# Patient Record
Sex: Female | Born: 1945
Health system: Southern US, Community
[De-identification: ages and names within clinical notes are randomized; demographics above are authoritative.]

## PROBLEM LIST (undated history)

## (undated) DIAGNOSIS — I10 Essential (primary) hypertension: Secondary | ICD-10-CM

## (undated) DIAGNOSIS — F329 Major depressive disorder, single episode, unspecified: Secondary | ICD-10-CM

## (undated) DIAGNOSIS — R51 Headache: Secondary | ICD-10-CM

## (undated) DIAGNOSIS — K219 Gastro-esophageal reflux disease without esophagitis: Secondary | ICD-10-CM

## (undated) DIAGNOSIS — D62 Acute posthemorrhagic anemia: Secondary | ICD-10-CM

## (undated) DIAGNOSIS — R739 Hyperglycemia, unspecified: Secondary | ICD-10-CM

## (undated) DIAGNOSIS — I739 Peripheral vascular disease, unspecified: Secondary | ICD-10-CM

## (undated) DIAGNOSIS — F32A Depression, unspecified: Secondary | ICD-10-CM

## (undated) DIAGNOSIS — R0602 Shortness of breath: Secondary | ICD-10-CM

## (undated) DIAGNOSIS — M1990 Unspecified osteoarthritis, unspecified site: Secondary | ICD-10-CM

## (undated) HISTORY — PX: RECTOVAGINAL FISTULA CLOSURE: SUR265

## (undated) HISTORY — PX: TONSILLECTOMY: SUR1361

## (undated) HISTORY — PX: TOTAL KNEE ARTHROPLASTY: SHX125

## (undated) HISTORY — PX: CARPAL TUNNEL RELEASE: SHX101

---

## 2006-04-13 ENCOUNTER — Encounter: Admission: RE | Admit: 2006-04-13 | Discharge: 2006-04-13 | Payer: Self-pay | Admitting: Emergency Medicine

## 2007-01-24 ENCOUNTER — Encounter: Admission: RE | Admit: 2007-01-24 | Discharge: 2007-01-24 | Payer: Self-pay | Admitting: Family Medicine

## 2007-01-28 ENCOUNTER — Encounter: Admission: RE | Admit: 2007-01-28 | Discharge: 2007-01-28 | Payer: Self-pay | Admitting: Family Medicine

## 2009-02-05 ENCOUNTER — Encounter: Payer: Self-pay | Admitting: Emergency Medicine

## 2009-02-05 ENCOUNTER — Ambulatory Visit: Payer: Self-pay | Admitting: Vascular Surgery

## 2009-02-05 ENCOUNTER — Ambulatory Visit (HOSPITAL_COMMUNITY): Admission: RE | Admit: 2009-02-05 | Discharge: 2009-02-05 | Payer: Self-pay | Admitting: Emergency Medicine

## 2009-09-13 ENCOUNTER — Encounter: Admission: RE | Admit: 2009-09-13 | Discharge: 2009-09-13 | Payer: Self-pay | Admitting: Family Medicine

## 2009-09-14 ENCOUNTER — Encounter: Admission: RE | Admit: 2009-09-14 | Discharge: 2009-09-14 | Payer: Self-pay | Admitting: Family Medicine

## 2010-02-08 ENCOUNTER — Observation Stay (HOSPITAL_COMMUNITY): Admission: EM | Admit: 2010-02-08 | Discharge: 2010-02-09 | Payer: Self-pay | Admitting: Emergency Medicine

## 2010-05-07 DIAGNOSIS — I739 Peripheral vascular disease, unspecified: Secondary | ICD-10-CM

## 2010-05-07 HISTORY — DX: Peripheral vascular disease, unspecified: I73.9

## 2010-05-07 HISTORY — PX: JOINT REPLACEMENT: SHX530

## 2010-05-11 ENCOUNTER — Inpatient Hospital Stay (HOSPITAL_COMMUNITY)
Admission: RE | Admit: 2010-05-11 | Discharge: 2010-05-16 | Payer: Self-pay | Source: Home / Self Care | Admitting: Orthopedic Surgery

## 2010-05-23 ENCOUNTER — Ambulatory Visit: Payer: Self-pay | Admitting: Vascular Surgery

## 2010-05-23 ENCOUNTER — Encounter (INDEPENDENT_AMBULATORY_CARE_PROVIDER_SITE_OTHER): Payer: Self-pay | Admitting: Orthopedic Surgery

## 2010-05-23 ENCOUNTER — Ambulatory Visit: Admission: RE | Admit: 2010-05-23 | Discharge: 2010-05-23 | Payer: Self-pay | Admitting: Orthopedic Surgery

## 2010-08-29 ENCOUNTER — Encounter: Payer: Self-pay | Admitting: Emergency Medicine

## 2010-08-29 ENCOUNTER — Encounter: Payer: Self-pay | Admitting: Family Medicine

## 2010-08-29 NOTE — Discharge Summary (Addendum)
NAMELELAH, RENNAKER NO.:  [id number]  MEDICAL RECORD NO.:  [id number]          PATIENT TYPE:  OUT  LOCATION:  DFTL                         FACILITY:  [facility]  PHYSICIAN:  Georges Lynch. Jamir Rone, M.D.DATE OF BIRTH:  06-Oct-1945  DATE OF ADMISSION:  05/23/2010 DATE OF DISCHARGE:  05/23/2010                              DISCHARGE SUMMARY   ADMITTING DIAGNOSES: 1. End-stage arthritis of the left knee. 2. History of migraines. 3. Anxiety. 4. Depression. 5. Dentures full, on top and partial and bottom. 6. Hypertension. 7. Hypercholesterolemia. 8. Gastroesophageal reflux disease. 9. Osteoarthritis. 10.History of menopause.  DISCHARGE DIAGNOSES: 1. End-stage arthritis of the left knee, status post left total knee     arthroplasty. 2. History of migraines. 3. Anxiety. 4. Depression. 5. Dentures full, on top and partial and bottom. 6. Hypertension. 7. Hypercholesterolemia. 8. Gastroesophageal reflux disease. 9. Osteoarthritis. 10.History of menopause.  LABORATORY DATA:  Preoperative CBC reveals white count of 5.0, hemoglobin 14.2, hematocrit 40.9, and a platelet count of 351. Preoperative INR was 0.94.  The patient was not on anticoagulants preoperatively.  Preoperative chemistry panel revealed very slight hypokalemia at 3.4 and elevated glucose at 107.  Preoperative urinalysis was unremarkable.  Preoperative PCR for MRSA and staph aureus were both negative.  On postoperative day #1, her hemoglobin dropped to 11.4, INR had reached 1.0.  On postop day #2, hemoglobin dropped further to 10.2. on postoperative day #3, dropped to a low of 10.1.  The patient did not require blood transfusion.  By postoperative day #5, her discharge day, hemoglobin had risen to 10.9 and by discharge her INR had reached 1.88.  HOSPITAL COURSE:  The patient was admitted to [facility] on May 11, 2010, and she underwent left total knee arthroplasty.  Please see the procedure  portion for that information.  After adequate time in the recovery area, she was transported to the floor for further recovery.  She was started on Coumadin the evening of her surgery and this was monitored per Pharmacy.  On postoperative day #1, the patient was doing well.  Her dressing was changed as she did have quite a bit of bleeding.  She was visited by Physical Therapy, postoperative day #1, however, they did not do morning session, the afternoon session.  She was able to ambulate 8 feet with moderate assistance and work on transfers.  Also, on postoperative day #1, the patient's Foley was discontinued and her PCA was also discontinued.  On postoperative day #2, the patient continued to progress.  She was having some difficulty with itching from her pain medications.  She was given Vistaril.  She also progressed with her physical therapy, by postop day #3, she was able to ambulate 100 feet using a rolling walker without assistance. Dressing was again changed and her wound is well-approximated, no signs of infection.  On postoperative day #5, she was discharged from the hospital.  DISPOSITION:  To home on postop day #5, May 16, 2010.  PROCEDURE:  On May 11, 2010, Ms. Wayment was taken to the operating room by surgeon, Dr. Ranee Gosselin; assistant, Rozell Searing PA-C. She underwent left  total knee arthroplasty under general anesthesia. Routine orthopedic prep and drape were carried out.  She received 1 g of IV Ancef preoperatively.  There were no complications with the procedure and she was returned to the recovery room in satisfactory condition. Postoperative radiographs revealed postsurgical changes of left total knee replacement without complication.  MEDICATIONS ON DISCHARGE: 1. Vicodin, she will discontinue. 2. Aspirin 81 mg, she will discontinue. 3. Culturelle. 4. Zyrtec. 5. Vitamin B12 6. Vitamin D. 7. Alprazolam 8. Hydrochlorothiazide. 9.  Cymbalta. 10.Lipitor. 11.Dexlansoprazole. 12.Potassium chloride. 13.Percocet. 14.Robaxin. 15.Coumadin.  SPECIAL INSTRUCTIONS:  Her Coumadin will be monitored per [facility] and her INR will be kept between 2-3 to remain therapeutic.  ACTIVITY:  She will increase activity slowly, again she will have home PT to help work on range of motion.  She is able to shower and she should use the walker at all times at this point.  WOUND CARE:  Daily dressing change.  DIET:  No restrictions.  FOLLOWUP:  Ms. Stoneberg will follow up with Dr. Darrelyn Hillock 2 weeks from the day of surgery.  She should contact the office at [phone] to schedule this appointment.  CONDITION ON DISCHARGE:  Improving.     Rozell Searing, PAC   ______________________________ Georges Lynch Darrelyn Hillock, M.D.    LD/MEDQ  D:  08/28/2010  T:  08/29/2010  Job:  [id number]  Electronically Signed by Rozell Searing  on 08/29/2010 09:20:43 AM Electronically Signed by Ranee Gosselin M.D. on 08/29/2010 11:50:17 AM

## 2010-10-20 LAB — URINALYSIS, ROUTINE W REFLEX MICROSCOPIC
Glucose, UA: NEGATIVE mg/dL
Ketones, ur: NEGATIVE mg/dL
Nitrite: NEGATIVE
Specific Gravity, Urine: 1.01 (ref 1.005–1.030)
pH: 6.5 (ref 5.0–8.0)

## 2010-10-20 LAB — APTT
aPTT: 29 seconds (ref 24–37)
aPTT: 34 seconds (ref 24–37)
aPTT: 47 seconds — ABNORMAL HIGH (ref 24–37)
aPTT: 60 seconds — ABNORMAL HIGH (ref 24–37)
aPTT: 30 seconds (ref 24–37)

## 2010-10-20 LAB — COMPREHENSIVE METABOLIC PANEL
ALT: 23 U/L (ref 0–35)
AST: 24 U/L (ref 0–37)
Albumin: 3.9 g/dL (ref 3.5–5.2)
Alkaline Phosphatase: 62 U/L (ref 39–117)
BUN: 8 mg/dL (ref 6–23)
Chloride: 101 mEq/L (ref 96–112)
GFR calc Af Amer: >60 mL/min (ref >60)
Potassium: 3.4 mEq/L — ABNORMAL LOW (ref 3.5–5.1)
Sodium: 139 mEq/L (ref 135–145)
Total Bilirubin: 0.5 mg/dL (ref 0.3–1.2)
Total Protein: 7.1 g/dL (ref 6.0–8.3)

## 2010-10-20 LAB — PROTIME-INR
INR: 1 (ref 0.00–1.49)
INR: 1.48 (ref 0.00–1.49)
Prothrombin Time: 13.4 seconds (ref 11.6–15.2)
Prothrombin Time: 19.7 seconds — ABNORMAL HIGH (ref 11.6–15.2)

## 2010-10-20 LAB — DIFFERENTIAL
Basophils Relative: 0 % (ref 0–1)
Eosinophils Relative: 5 % (ref 0–5)
Monocytes Absolute: 0.4 10*3/uL (ref 0.1–1.0)
Monocytes Relative: 9 % (ref 3–12)
Neutro Abs: 2.4 10*3/uL (ref 1.7–7.7)

## 2010-10-20 LAB — TYPE AND SCREEN

## 2010-10-20 LAB — CBC
Platelets: 351 10*3/uL (ref 150–400)
RBC: 4.32 MIL/uL (ref 3.87–5.11)
RDW: 14.9 % (ref 11.5–15.5)
WBC: 5 10*3/uL (ref 4.0–10.5)

## 2010-10-20 LAB — HEMOGLOBIN AND HEMATOCRIT, BLOOD
HCT: 30 % — ABNORMAL LOW (ref 36.0–46.0)
HCT: 30 % — ABNORMAL LOW (ref 36.0–46.0)
Hemoglobin: 10.2 g/dL — ABNORMAL LOW (ref 12.0–15.0)
Hemoglobin: 10.3 g/dL — ABNORMAL LOW (ref 12.0–15.0)

## 2010-10-20 LAB — SURGICAL PCR SCREEN: MRSA, PCR: NEGATIVE

## 2010-10-23 LAB — MAGNESIUM
Magnesium: 1.6 mg/dL (ref 1.5–2.5)
Magnesium: 2.4 mg/dL (ref 1.5–2.5)

## 2010-10-23 LAB — POCT I-STAT, CHEM 8
BUN: 5 mg/dL — ABNORMAL LOW (ref 6–23)
Chloride: 94 mEq/L — ABNORMAL LOW (ref 96–112)
Glucose, Bld: 89 mg/dL (ref 70–99)
HCT: 42 % (ref 36.0–46.0)
Potassium: 2.2 mEq/L — CL (ref 3.5–5.1)

## 2010-10-23 LAB — COMPREHENSIVE METABOLIC PANEL
ALT: 16 U/L (ref 0–35)
AST: 17 U/L (ref 0–37)
Albumin: 3.2 g/dL — ABNORMAL LOW (ref 3.5–5.2)
Alkaline Phosphatase: 53 U/L (ref 39–117)
Potassium: 3.4 mEq/L — ABNORMAL LOW (ref 3.5–5.1)
Sodium: 141 mEq/L (ref 135–145)
Total Protein: 5.7 g/dL — ABNORMAL LOW (ref 6.0–8.3)

## 2010-10-23 LAB — CBC
Platelets: 292 10*3/uL (ref 150–400)
RBC: 4 MIL/uL (ref 3.87–5.11)
RDW: 14.7 % (ref 11.5–15.5)
WBC: 4.6 10*3/uL (ref 4.0–10.5)

## 2010-10-23 LAB — URINALYSIS, ROUTINE W REFLEX MICROSCOPIC
Bilirubin Urine: NEGATIVE
Glucose, UA: NEGATIVE mg/dL
Nitrite: NEGATIVE
Specific Gravity, Urine: 1.005 (ref 1.005–1.030)
pH: 6.5 (ref 5.0–8.0)

## 2010-10-23 LAB — PHOSPHORUS: Phosphorus: 2.9 mg/dL (ref 2.3–4.6)

## 2010-10-23 LAB — VITAMIN B12: Vitamin B-12: 280 pg/mL (ref 211–911)

## 2010-10-23 LAB — TSH: TSH: 1.21 u[IU]/mL (ref 0.350–4.500)

## 2012-02-22 ENCOUNTER — Other Ambulatory Visit (HOSPITAL_COMMUNITY)
Admission: RE | Admit: 2012-02-22 | Discharge: 2012-02-22 | Disposition: A | Discharge status: Home / Self Care | Payer: BC Managed Care – PPO | Source: Ambulatory Visit | Attending: Family Medicine | Admitting: Family Medicine

## 2012-02-22 DIAGNOSIS — Z124 Encounter for screening for malignant neoplasm of cervix: Secondary | ICD-10-CM | POA: Insufficient documentation

## 2012-02-22 DIAGNOSIS — Z1151 Encounter for screening for human papillomavirus (HPV): Secondary | ICD-10-CM | POA: Insufficient documentation

## 2012-05-24 ENCOUNTER — Other Ambulatory Visit: Payer: Self-pay | Admitting: Orthopedic Surgery

## 2012-05-24 DIAGNOSIS — M5137 Other intervertebral disc degeneration, lumbosacral region: Secondary | ICD-10-CM

## 2012-05-27 ENCOUNTER — Other Ambulatory Visit: Payer: BC Managed Care – PPO

## 2012-05-28 ENCOUNTER — Ambulatory Visit
Admission: RE | Admit: 2012-05-28 | Discharge: 2012-05-28 | Disposition: A | Discharge status: Home / Self Care | Payer: BC Managed Care – PPO | Source: Ambulatory Visit | Attending: Orthopedic Surgery | Admitting: Orthopedic Surgery

## 2012-05-28 DIAGNOSIS — M5137 Other intervertebral disc degeneration, lumbosacral region: Secondary | ICD-10-CM

## 2012-06-03 ENCOUNTER — Other Ambulatory Visit: Payer: Self-pay | Admitting: Orthopedic Surgery

## 2012-06-03 DIAGNOSIS — M79605 Pain in left leg: Secondary | ICD-10-CM

## 2012-06-21 ENCOUNTER — Other Ambulatory Visit: Payer: BC Managed Care – PPO

## 2012-07-17 NOTE — H&P (Signed)
TOTAL HIP ADMISSION H&P  Patient is admitted for left total hip arthroplasty.  Subjective:  Chief Complaint: left hip pain  HPI: Jamie Gilmore, 66 y.o. female, has a history of pain and functional disability in the left hip(s) due to avascular necrosis and patient has failed non-surgical conservative treatments for greater than 12 weeks to include NSAID's and/or analgesics, corticosteriod injections, use of assistive devices and activity modification.  Onset of symptoms was gradual starting 1 year ago with rapidlly worsening course since that time.The patient noted no past surgery on the left hip(s).  Patient currently rates pain in the left hip at 9 out of 10 with activity. Patient has night pain, worsening of pain with activity and weight bearing, pain that interfers with activities of daily living and pain with passive range of motion. Patient has evidence of subchondral cysts, joint space narrowing and avascular necrosis by imaging studies. This condition presents safety issues increasing the risk of falls. There is no current active infection.  Past Medical History Migraines Anxiety Hypertension Hypercholesteremia GERD Menopause  Past Surgical History Tubal Ligation Carpal Tunnel Release, left Left knee arthroscopy Left TKA Tonsillectomy  Allergies NDKA   History  Substance Use Topics  . Smoking status: Former smoker  . Smokeless tobacco: None  . Alcohol Use: Occasional glass of wine    Family History Father dies age 110 due to cardiomegaly, TB complications Mother deceased age 68 due to CHF, cancer  Review of Systems  Constitutional: Positive for malaise/fatigue. Negative for fever, chills, weight loss and diaphoresis.  HENT: Negative.  Negative for neck pain.   Eyes: Negative.   Respiratory: Negative.   Cardiovascular: Negative.   Gastrointestinal: Positive for heartburn. Negative for nausea, vomiting, abdominal pain, diarrhea, constipation, blood in stool and  melena.  Genitourinary: Negative.   Musculoskeletal: Positive for myalgias, back pain and joint pain. Negative for falls.       Left hip pain  Skin: Negative.   Neurological: Negative.  Negative for weakness.  Endo/Heme/Allergies: Negative.   Psychiatric/Behavioral: Negative.     Objective:  Physical Exam  Constitutional: She is oriented to person, place, and time. She appears well-developed and well-nourished. No distress.  HENT:  Head: Normocephalic and atraumatic.  Right Ear: External ear normal.  Left Ear: External ear normal.  Nose: Nose normal.  Mouth/Throat: Oropharynx is clear and moist.  Eyes: Conjunctivae normal and EOM are normal.  Neck: Normal range of motion. Neck supple. No tracheal deviation present. No thyromegaly present.  Cardiovascular: Normal rate, regular rhythm, normal heart sounds and intact distal pulses.   No murmur heard. Respiratory: Effort normal and breath sounds normal. No respiratory distress. She has no wheezes. She exhibits no tenderness.  GI: Soft. Bowel sounds are normal. She exhibits no distension and no mass. There is no tenderness.  Musculoskeletal:       Right hip: Normal.       Left hip: She exhibits decreased range of motion and tenderness.       Right knee: Normal.       Left knee: Normal.       Right lower leg: She exhibits no tenderness and no swelling.       Left lower leg: She exhibits no tenderness and no swelling.       Legs: Lymphadenopathy:    She has no cervical adenopathy.  Neurological: She is alert and oriented to person, place, and time. She has normal strength and normal reflexes. No sensory deficit.  Skin: No rash  noted. She is not diaphoretic. No erythema.  Psychiatric: She has a normal mood and affect. Her behavior is normal.    Vitals Weight: 219 lb Height: 65 in Body Surface Area: 2.13 m Body Mass Index: 36.44 kg/m Pulse: 82 (Regular) BP: 132/85 (Sitting, Left Arm, Standard)  Imaging Review Plain  radiographs demonstrate moderate degenerative joint disease of the left hip(s) due to avascular necrosis. The bone quality appears to be fair for age and reported activity level.  Assessment/Plan:  End stage avascular necrosis, left hip(s)  The patient history, physical examination, clinical judgement of the provider and imaging studies are consistent with end stage degenerative joint disease of the left hip(s) due to avascular necrosis and total hip arthroplasty is deemed medically necessary. The treatment options including medical management, injection therapy, arthroscopy and arthroplasty were discussed at length. The risks and benefits of total hip arthroplasty were presented and reviewed. The risks due to aseptic loosening, infection, stiffness, dislocation/subluxation,  thromboembolic complications and other imponderables were discussed.  The patient acknowledged the explanation, agreed to proceed with the plan and consent was signed. Patient is being admitted for inpatient treatment for surgery, pain control, PT, OT, prophylactic antibiotics, VTE prophylaxis, progressive ambulation and ADL's and discharge planning.The patient is planning to be discharged home with home health services     [place], [place]

## 2012-07-22 ENCOUNTER — Other Ambulatory Visit (HOSPITAL_COMMUNITY): Payer: Self-pay | Admitting: Orthopedic Surgery

## 2012-07-22 ENCOUNTER — Encounter (HOSPITAL_COMMUNITY): Payer: Self-pay | Admitting: Pharmacy Technician

## 2012-07-23 ENCOUNTER — Encounter (HOSPITAL_COMMUNITY)
Admission: RE | Admit: 2012-07-23 | Discharge: 2012-07-23 | Disposition: A | Discharge status: Home / Self Care | Payer: BC Managed Care – PPO | Source: Ambulatory Visit | Attending: Orthopedic Surgery | Admitting: Orthopedic Surgery

## 2012-07-23 ENCOUNTER — Encounter (HOSPITAL_COMMUNITY): Payer: Self-pay

## 2012-07-23 ENCOUNTER — Ambulatory Visit (HOSPITAL_COMMUNITY)
Admission: RE | Admit: 2012-07-23 | Discharge: 2012-07-23 | Disposition: A | Discharge status: Home / Self Care | Payer: BC Managed Care – PPO | Source: Ambulatory Visit | Attending: Surgical | Admitting: Surgical

## 2012-07-23 DIAGNOSIS — Z01812 Encounter for preprocedural laboratory examination: Secondary | ICD-10-CM | POA: Insufficient documentation

## 2012-07-23 DIAGNOSIS — Z01818 Encounter for other preprocedural examination: Secondary | ICD-10-CM | POA: Insufficient documentation

## 2012-07-23 HISTORY — DX: Shortness of breath: R06.02

## 2012-07-23 HISTORY — DX: Depression, unspecified: F32.A

## 2012-07-23 HISTORY — DX: Essential (primary) hypertension: I10

## 2012-07-23 HISTORY — DX: Peripheral vascular disease, unspecified: I73.9

## 2012-07-23 HISTORY — DX: Hyperglycemia, unspecified: R73.9

## 2012-07-23 HISTORY — DX: Gastro-esophageal reflux disease without esophagitis: K21.9

## 2012-07-23 HISTORY — DX: Unspecified osteoarthritis, unspecified site: M19.90

## 2012-07-23 HISTORY — DX: Major depressive disorder, single episode, unspecified: F32.9

## 2012-07-23 HISTORY — DX: Headache: R51

## 2012-07-23 LAB — COMPREHENSIVE METABOLIC PANEL
ALT: 19 U/L (ref 0–35)
AST: 14 U/L (ref 0–37)
Albumin: 3.6 g/dL (ref 3.5–5.2)
Alkaline Phosphatase: 77 U/L (ref 39–117)
BUN: 8 mg/dL (ref 6–23)
CO2: 30 mEq/L (ref 19–32)
Calcium: 9.5 mg/dL (ref 8.4–10.5)
Chloride: 98 mEq/L (ref 96–112)
Creatinine, Ser: 0.66 mg/dL (ref 0.50–1.10)
GFR calc Af Amer: >90 mL/min (ref >90)
GFR calc non Af Amer: >90 mL/min (ref >90)
Glucose, Bld: 109 mg/dL — ABNORMAL HIGH (ref 70–99)
Potassium: 4 mEq/L (ref 3.5–5.1)
Sodium: 138 mEq/L (ref 135–145)
Total Bilirubin: 0.4 mg/dL (ref 0.3–1.2)
Total Protein: 7 g/dL (ref 6.0–8.3)

## 2012-07-23 LAB — APTT: aPTT: 30 seconds (ref 24–37)

## 2012-07-23 LAB — SURGICAL PCR SCREEN
MRSA, PCR: NEGATIVE
Staphylococcus aureus: POSITIVE — AB

## 2012-07-23 LAB — URINALYSIS, ROUTINE W REFLEX MICROSCOPIC
Bilirubin Urine: NEGATIVE
Glucose, UA: NEGATIVE mg/dL
Hgb urine dipstick: NEGATIVE
Ketones, ur: NEGATIVE mg/dL
Nitrite: NEGATIVE
Protein, ur: NEGATIVE mg/dL
Specific Gravity, Urine: 1.019 (ref 1.005–1.030)
Urobilinogen, UA: 0.2 mg/dL (ref 0.0–1.0)
pH: 8 (ref 5.0–8.0)

## 2012-07-23 LAB — CBC
Hemoglobin: 14.1 g/dL (ref 12.0–15.0)
RBC: 4.61 MIL/uL (ref 3.87–5.11)

## 2012-07-23 LAB — URINE MICROSCOPIC-ADD ON

## 2012-07-23 LAB — PROTIME-INR
INR: 0.95 (ref 0.00–1.49)
Prothrombin Time: 12.6 seconds (ref 11.6–15.2)

## 2012-07-23 NOTE — Progress Notes (Signed)
ekg 11/13 on chart

## 2012-07-23 NOTE — Progress Notes (Signed)
Faxed abnormal urine with micro to Dr Darrelyn Hillock per [employer],  Faxed STOP BANG SCREEN to Juanita Craver PA  With confirmation

## 2012-07-23 NOTE — Progress Notes (Signed)
07/23/12 0935  OBSTRUCTIVE SLEEP APNEA  Have you ever been diagnosed with sleep apnea through a sleep study? No  Do you snore loudly (loud enough to be heard through closed doors)?  0  Do you often feel tired, fatigued, or sleepy during the daytime? 1  Has anyone observed you stop breathing during your sleep? 0  Do you have, or are you being treated for high blood pressure? 1  BMI more than 35 kg/m2? 1  Age over 64 years old? 1  Neck circumference greater than 40 cm/18 inches? 0  Gender: 0  Obstructive Sleep Apnea Score 4   Score 4 or greater  Results sent to PCP

## 2012-07-23 NOTE — Patient Instructions (Signed)
20 Jamie Gilmore  07/23/2012   Your procedure is scheduled on:  07/29/12  MONDAY  Report to [facility] at   0530    AM.  Call this number if you have problems the morning of surgery: [phone]       Remember:   Do not eat food  Or drink :After Midnight.  Sunday NIGHT   Take these medicines the morning of surgery with A SIP OF WATER:  PROZAC, PROTONIX      VENTOLIN inhaler  Or NORCO IF NEEDED   .  Contacts, dentures or partial plates can not be worn to surgery  Leave suitcase in the car. After surgery it may be brought to your room.  For patients admitted to the hospital, checkout time is 11:00 AM day of  discharge.             SPECIAL INSTRUCTIONS- SEE [place] PREPARING FOR SURGERY INSTRUCTION SHEET-     DO NOT WEAR JEWELRY, LOTIONS, POWDERS, OR PERFUMES.  WOMEN-- DO NOT SHAVE LEGS OR UNDERARMS FOR 12 HOURS BEFORE SHOWERS. MEN MAY SHAVE FACE.  Patients discharged the day of surgery will not be allowed to drive home. IF going home the day of surgery, you must have a driver and someone to stay with you for the first 24 hours  Name and phone number of your driver:   ? rehab                                                                     Please read over the following fact sheets that you were given: MRSA Information, Incentive Spirometry Sheet, Blood Transfusion Sheet  Information                                                                                   [name]  PST 336  [phone]                 FAILURE TO FOLLOW THESE INSTRUCTIONS MAY RESULT IN  CANCELLATION   OF YOUR SURGERY                                                  Patient Signature _____________________________

## 2012-07-28 NOTE — Anesthesia Preprocedure Evaluation (Addendum)
Anesthesia Evaluation  Patient identified by MRN, date of birth, ID band Patient awake    Reviewed: Allergy & Precautions, H&P , NPO status , Patient's Chart, lab work & pertinent test results  Airway Mallampati: II TM Distance: >3 FB Neck ROM: full    Dental  (+) Edentulous Upper, Edentulous Lower and Dental Advisory Given   Pulmonary neg pulmonary ROS, shortness of breath and with exertion,  breath sounds clear to auscultation  Pulmonary exam normal       Cardiovascular hypertension, Pt. on medications Rhythm:regular Rate:Normal     Neuro/Psych Depression negative neurological ROS  negative psych ROS   GI/Hepatic negative GI ROS, Neg liver ROS, GERD-  Medicated,  Endo/Other  diabetesBorderline DM  Renal/GU negative Renal ROS  negative genitourinary   Musculoskeletal   Abdominal   Peds  Hematology negative hematology ROS (+) Hx. DVT s/p TKA   Anesthesia Other Findings   Reproductive/Obstetrics negative OB ROS                          Anesthesia Physical Anesthesia Plan  ASA: II  Anesthesia Plan: General   Post-op Pain Management:    Induction: Intravenous  Airway Management Planned: Oral ETT  Additional Equipment:   Intra-op Plan:   Post-operative Plan: Extubation in OR  Informed Consent: I have reviewed the patients History and Physical, chart, labs and discussed the procedure including the risks, benefits and alternatives for the proposed anesthesia with the patient or authorized representative who has indicated his/her understanding and acceptance.   Dental Advisory Given  Plan Discussed with: CRNA and Surgeon  Anesthesia Plan Comments:         Anesthesia Quick Evaluation

## 2012-07-29 ENCOUNTER — Inpatient Hospital Stay (HOSPITAL_COMMUNITY): Payer: BC Managed Care – PPO | Admitting: Anesthesiology

## 2012-07-29 ENCOUNTER — Encounter (HOSPITAL_COMMUNITY)
Admission: RE | Disposition: A | Discharge status: Skilled Nursing Facility | Payer: Self-pay | Source: Ambulatory Visit | Attending: Orthopedic Surgery

## 2012-07-29 ENCOUNTER — Encounter (HOSPITAL_COMMUNITY): Payer: Self-pay | Admitting: *Deleted

## 2012-07-29 ENCOUNTER — Inpatient Hospital Stay (HOSPITAL_COMMUNITY)
Admission: RE | Admit: 2012-07-29 | Discharge: 2012-08-05 | DRG: 818 | Disposition: A | Discharge status: Skilled Nursing Facility | Payer: BC Managed Care – PPO | Source: Ambulatory Visit | Attending: Orthopedic Surgery | Admitting: Orthopedic Surgery

## 2012-07-29 ENCOUNTER — Encounter (HOSPITAL_COMMUNITY): Payer: Self-pay | Admitting: Anesthesiology

## 2012-07-29 ENCOUNTER — Inpatient Hospital Stay (HOSPITAL_COMMUNITY): Payer: BC Managed Care – PPO

## 2012-07-29 DIAGNOSIS — D62 Acute posthemorrhagic anemia: Secondary | ICD-10-CM

## 2012-07-29 DIAGNOSIS — Z96649 Presence of unspecified artificial hip joint: Secondary | ICD-10-CM

## 2012-07-29 DIAGNOSIS — M87059 Idiopathic aseptic necrosis of unspecified femur: Principal | ICD-10-CM | POA: Diagnosis present

## 2012-07-29 DIAGNOSIS — F3289 Other specified depressive episodes: Secondary | ICD-10-CM | POA: Diagnosis present

## 2012-07-29 DIAGNOSIS — E119 Type 2 diabetes mellitus without complications: Secondary | ICD-10-CM | POA: Diagnosis present

## 2012-07-29 DIAGNOSIS — F329 Major depressive disorder, single episode, unspecified: Secondary | ICD-10-CM | POA: Diagnosis present

## 2012-07-29 DIAGNOSIS — M8970 Major osseous defect, unspecified site: Secondary | ICD-10-CM | POA: Diagnosis present

## 2012-07-29 DIAGNOSIS — K219 Gastro-esophageal reflux disease without esophagitis: Secondary | ICD-10-CM | POA: Diagnosis present

## 2012-07-29 DIAGNOSIS — I10 Essential (primary) hypertension: Secondary | ICD-10-CM | POA: Diagnosis present

## 2012-07-29 HISTORY — PX: TOTAL HIP ARTHROPLASTY: SHX124

## 2012-07-29 LAB — TYPE AND SCREEN
ABO/RH(D): O POS
Antibody Screen: NEGATIVE

## 2012-07-29 SURGERY — ARTHROPLASTY, HIP, TOTAL,POSTERIOR APPROACH
Anesthesia: General | Site: Hip | Laterality: Left | Wound class: Clean

## 2012-07-29 MED ORDER — FLEET ENEMA 7-19 GM/118ML RE ENEM: 1.0000 | ENEMA | Freq: Once | RECTAL | Status: AC | PRN

## 2012-07-29 MED ORDER — ZOLPIDEM TARTRATE 10 MG PO TABS
10.0000 mg | ORAL_TABLET | Freq: Every evening | ORAL | Status: DC | PRN
  Administered 2012-07-30 – 2012-08-04 (×6): 10 mg via ORAL
  Filled 2012-07-29 (×6): qty 1

## 2012-07-29 MED ORDER — SODIUM CHLORIDE 0.9 % IR SOLN
Status: DC | PRN
  Administered 2012-07-29: 09:00:00

## 2012-07-29 MED ORDER — LIDOCAINE HCL (CARDIAC) 20 MG/ML IV SOLN
INTRAVENOUS | Status: DC | PRN
  Administered 2012-07-29: 50 mg via INTRAVENOUS

## 2012-07-29 MED ORDER — ONDANSETRON HCL 4 MG PO TABS: 4.0000 mg | ORAL_TABLET | Freq: Four times a day (QID) | ORAL | Status: DC | PRN

## 2012-07-29 MED ORDER — METOCLOPRAMIDE HCL 5 MG/ML IJ SOLN
INTRAMUSCULAR | Status: DC | PRN
  Administered 2012-07-29: 10 mg via INTRAVENOUS

## 2012-07-29 MED ORDER — SUCCINYLCHOLINE CHLORIDE 20 MG/ML IJ SOLN
INTRAMUSCULAR | Status: DC | PRN
  Administered 2012-07-29: 100 mg via INTRAVENOUS

## 2012-07-29 MED ORDER — ONDANSETRON HCL 4 MG/2ML IJ SOLN: 4.0000 mg | Freq: Four times a day (QID) | INTRAMUSCULAR | Status: DC | PRN

## 2012-07-29 MED ORDER — GLYCOPYRROLATE 0.2 MG/ML IJ SOLN
INTRAMUSCULAR | Status: DC | PRN
  Administered 2012-07-29: 0.4 mg via INTRAVENOUS

## 2012-07-29 MED ORDER — SODIUM CHLORIDE 0.9 % IR SOLN
Status: DC | PRN
  Administered 2012-07-29: 08:00:00

## 2012-07-29 MED ORDER — METHOCARBAMOL 100 MG/ML IJ SOLN
500.0000 mg | Freq: Four times a day (QID) | INTRAVENOUS | Status: DC | PRN
  Administered 2012-07-29 (×2): 500 mg via INTRAVENOUS
  Filled 2012-07-29 (×2): qty 5

## 2012-07-29 MED ORDER — ATORVASTATIN CALCIUM 20 MG PO TABS
20.0000 mg | ORAL_TABLET | Freq: Every day | ORAL | Status: DC
  Administered 2012-07-29 – 2012-08-04 (×7): 20 mg via ORAL
  Filled 2012-07-29 (×9): qty 1

## 2012-07-29 MED ORDER — CEFAZOLIN SODIUM 1-5 GM-% IV SOLN
1.0000 g | Freq: Four times a day (QID) | INTRAVENOUS | Status: AC
  Administered 2012-07-29 (×2): 1 g via INTRAVENOUS
  Filled 2012-07-29 (×2): qty 50

## 2012-07-29 MED ORDER — POTASSIUM CHLORIDE CRYS ER 10 MEQ PO TBCR
10.0000 meq | EXTENDED_RELEASE_TABLET | Freq: Two times a day (BID) | ORAL | Status: DC
  Administered 2012-07-29 – 2012-08-05 (×15): 10 meq via ORAL
  Filled 2012-07-29 (×18): qty 1

## 2012-07-29 MED ORDER — HYDROMORPHONE HCL PF 1 MG/ML IJ SOLN
INTRAMUSCULAR | Status: DC | PRN
  Administered 2012-07-29 (×4): 0.5 mg via INTRAVENOUS

## 2012-07-29 MED ORDER — BUPIVACAINE LIPOSOME 1.3 % IJ SUSP
20.0000 mL | Freq: Once | INTRAMUSCULAR | Status: AC
  Administered 2012-07-29: 20 mL
  Filled 2012-07-29: qty 20

## 2012-07-29 MED ORDER — PHENOL 1.4 % MT LIQD: 1.0000 | OROMUCOSAL | Status: DC | PRN

## 2012-07-29 MED ORDER — ALUM & MAG HYDROXIDE-SIMETH 200-200-20 MG/5ML PO SUSP
30.0000 mL | ORAL | Status: DC | PRN
  Administered 2012-07-30 – 2012-08-02 (×6): 30 mL via ORAL
  Filled 2012-07-29 (×6): qty 30

## 2012-07-29 MED ORDER — ACETAMINOPHEN 10 MG/ML IV SOLN
INTRAVENOUS | Status: DC | PRN
  Administered 2012-07-29: 1000 mg via INTRAVENOUS

## 2012-07-29 MED ORDER — POLYETHYLENE GLYCOL 3350 17 G PO PACK: 17.0000 g | PACK | Freq: Every day | ORAL | Status: DC | PRN

## 2012-07-29 MED ORDER — SODIUM CHLORIDE 0.9 % IJ SOLN
INTRAMUSCULAR | Status: DC | PRN
  Administered 2012-07-29: 20 mL

## 2012-07-29 MED ORDER — ONDANSETRON HCL 4 MG/2ML IJ SOLN
INTRAMUSCULAR | Status: DC | PRN
  Administered 2012-07-29: 4 mg via INTRAVENOUS

## 2012-07-29 MED ORDER — ALBUTEROL SULFATE HFA 108 (90 BASE) MCG/ACT IN AERS
2.0000 | INHALATION_SPRAY | Freq: Four times a day (QID) | RESPIRATORY_TRACT | Status: DC | PRN
  Filled 2012-07-29: qty 6.7

## 2012-07-29 MED ORDER — BISACODYL 10 MG RE SUPP: 10.0000 mg | Freq: Every day | RECTAL | Status: DC | PRN

## 2012-07-29 MED ORDER — ACETAMINOPHEN 325 MG PO TABS: 650.0000 mg | ORAL_TABLET | Freq: Four times a day (QID) | ORAL | Status: DC | PRN

## 2012-07-29 MED ORDER — AMLODIPINE BESYLATE 5 MG PO TABS
5.0000 mg | ORAL_TABLET | Freq: Every day | ORAL | Status: DC
  Administered 2012-07-31 – 2012-08-04 (×5): 5 mg via ORAL
  Filled 2012-07-29 (×9): qty 1

## 2012-07-29 MED ORDER — MENTHOL 3 MG MT LOZG
1.0000 | LOZENGE | OROMUCOSAL | Status: DC | PRN
  Administered 2012-07-30: 3 mg via ORAL
  Filled 2012-07-29: qty 9

## 2012-07-29 MED ORDER — LACTATED RINGERS IV SOLN: INTRAVENOUS | Status: DC

## 2012-07-29 MED ORDER — ACETAMINOPHEN 650 MG RE SUPP: 650.0000 mg | Freq: Four times a day (QID) | RECTAL | Status: DC | PRN

## 2012-07-29 MED ORDER — PANTOPRAZOLE SODIUM 20 MG PO TBEC
20.0000 mg | DELAYED_RELEASE_TABLET | Freq: Every day | ORAL | Status: DC
  Administered 2012-07-30 – 2012-08-05 (×7): 20 mg via ORAL
  Filled 2012-07-29 (×11): qty 1

## 2012-07-29 MED ORDER — PROPOFOL 10 MG/ML IV BOLUS
INTRAVENOUS | Status: DC | PRN
  Administered 2012-07-29: 180 mg via INTRAVENOUS

## 2012-07-29 MED ORDER — LACTATED RINGERS IV SOLN
INTRAVENOUS | Status: DC
  Administered 2012-07-29 (×3): via INTRAVENOUS

## 2012-07-29 MED ORDER — NEOSTIGMINE METHYLSULFATE 1 MG/ML IJ SOLN
INTRAMUSCULAR | Status: DC | PRN
  Administered 2012-07-29: 3 mg via INTRAVENOUS

## 2012-07-29 MED ORDER — RIVAROXABAN 10 MG PO TABS
10.0000 mg | ORAL_TABLET | Freq: Every day | ORAL | Status: DC
  Administered 2012-07-30 – 2012-08-05 (×7): 10 mg via ORAL
  Filled 2012-07-29 (×10): qty 1

## 2012-07-29 MED ORDER — HYDROMORPHONE HCL PF 1 MG/ML IJ SOLN
1.0000 mg | INTRAMUSCULAR | Status: DC | PRN
  Administered 2012-07-29 – 2012-07-30 (×5): 1 mg via INTRAVENOUS
  Filled 2012-07-29 (×6): qty 1

## 2012-07-29 MED ORDER — CEFAZOLIN SODIUM-DEXTROSE 2-3 GM-% IV SOLR
2.0000 g | INTRAVENOUS | Status: AC
  Administered 2012-07-29: 2 g via INTRAVENOUS

## 2012-07-29 MED ORDER — LACTATED RINGERS IV SOLN
INTRAVENOUS | Status: DC
  Administered 2012-07-29: 100 mL/h via INTRAVENOUS
  Administered 2012-07-29 – 2012-07-30 (×3): via INTRAVENOUS

## 2012-07-29 MED ORDER — HYDROCODONE-ACETAMINOPHEN 5-325 MG PO TABS
1.0000 | ORAL_TABLET | ORAL | Status: DC | PRN
  Administered 2012-07-29 – 2012-08-05 (×33): 2 via ORAL
  Filled 2012-07-29 (×33): qty 2

## 2012-07-29 MED ORDER — HYDROCHLOROTHIAZIDE 25 MG PO TABS
25.0000 mg | ORAL_TABLET | Freq: Every day | ORAL | Status: DC
  Administered 2012-07-30 – 2012-08-05 (×7): 25 mg via ORAL
  Filled 2012-07-29 (×8): qty 1

## 2012-07-29 MED ORDER — OXYCODONE-ACETAMINOPHEN 5-325 MG PO TABS
2.0000 | ORAL_TABLET | ORAL | Status: DC | PRN
  Administered 2012-07-30 – 2012-07-31 (×6): 2 via ORAL
  Filled 2012-07-29 (×6): qty 2

## 2012-07-29 MED ORDER — MIDAZOLAM HCL 5 MG/5ML IJ SOLN
INTRAMUSCULAR | Status: DC | PRN
  Administered 2012-07-29: 2 mg via INTRAVENOUS

## 2012-07-29 MED ORDER — FLUOXETINE HCL 20 MG PO TABS
20.0000 mg | ORAL_TABLET | Freq: Every day | ORAL | Status: DC
  Administered 2012-07-30 – 2012-08-05 (×7): 20 mg via ORAL
  Filled 2012-07-29 (×10): qty 1

## 2012-07-29 MED ORDER — METHOCARBAMOL 500 MG PO TABS
500.0000 mg | ORAL_TABLET | Freq: Four times a day (QID) | ORAL | Status: DC | PRN
  Administered 2012-07-29 – 2012-08-05 (×17): 500 mg via ORAL
  Filled 2012-07-29 (×18): qty 1

## 2012-07-29 MED ORDER — ROCURONIUM BROMIDE 100 MG/10ML IV SOLN
INTRAVENOUS | Status: DC | PRN
  Administered 2012-07-29: 15 mg via INTRAVENOUS
  Administered 2012-07-29: 55 mg via INTRAVENOUS
  Administered 2012-07-29: 20 mg via INTRAVENOUS

## 2012-07-29 MED ORDER — FERROUS SULFATE 325 (65 FE) MG PO TABS
325.0000 mg | ORAL_TABLET | Freq: Three times a day (TID) | ORAL | Status: DC
  Administered 2012-07-29 – 2012-08-05 (×19): 325 mg via ORAL
  Filled 2012-07-29 (×27): qty 1

## 2012-07-29 MED ORDER — HYDROMORPHONE HCL PF 1 MG/ML IJ SOLN
0.2500 mg | INTRAMUSCULAR | Status: DC | PRN
  Administered 2012-07-29 (×4): 0.5 mg via INTRAVENOUS

## 2012-07-29 MED ORDER — THROMBIN 5000 UNITS EX SOLR
OROMUCOSAL | Status: DC | PRN
  Administered 2012-07-29: 09:00:00 via TOPICAL

## 2012-07-29 MED ORDER — SUFENTANIL CITRATE 50 MCG/ML IV SOLN
INTRAVENOUS | Status: DC | PRN
  Administered 2012-07-29 (×2): 5 ug via INTRAVENOUS
  Administered 2012-07-29: 10 ug via INTRAVENOUS
  Administered 2012-07-29 (×3): 5 ug via INTRAVENOUS
  Administered 2012-07-29: 10 ug via INTRAVENOUS
  Administered 2012-07-29: 5 ug via INTRAVENOUS

## 2012-07-29 SURGICAL SUPPLY — 54 items
ADH SKN CLS APL DERMABOND .7 (GAUZE/BANDAGES/DRESSINGS) ×1
BAG SPEC THK2 15X12 ZIP CLS (MISCELLANEOUS) ×1
BAG ZIPLOCK 12X15 (MISCELLANEOUS) ×2 IMPLANT
BLADE SAW SAG 73X25 THK (BLADE) ×1
BLADE SAW SGTL 73X25 THK (BLADE) ×1 IMPLANT
CLOTH BEACON ORANGE TIMEOUT ST (SAFETY) ×2 IMPLANT
DERMABOND ADVANCED (GAUZE/BANDAGES/DRESSINGS) ×1
DERMABOND ADVANCED .7 DNX12 (GAUZE/BANDAGES/DRESSINGS) IMPLANT
DRAPE INCISE IOBAN 66X45 STRL (DRAPES) ×2 IMPLANT
DRAPE INCISE IOBAN 85X60 (DRAPES) ×2 IMPLANT
DRAPE ORTHO SPLIT 77X108 STRL (DRAPES) ×4
DRAPE POUCH INSTRU U-SHP 10X18 (DRAPES) ×2 IMPLANT
DRAPE SURG 17X11 SM STRL (DRAPES) ×2 IMPLANT
DRAPE SURG ORHT 6 SPLT 77X108 (DRAPES) ×2 IMPLANT
DRAPE U-SHAPE 47X51 STRL (DRAPES) ×2 IMPLANT
DRSG AQUACEL AG ADV 3.5X 4 (GAUZE/BANDAGES/DRESSINGS) ×1 IMPLANT
DRSG AQUACEL AG ADV 3.5X10 (GAUZE/BANDAGES/DRESSINGS) ×1 IMPLANT
DRSG TEGADERM 4X4.75 (GAUZE/BANDAGES/DRESSINGS) ×1 IMPLANT
DURAPREP 26ML APPLICATOR (WOUND CARE) ×2 IMPLANT
ELECT BLADE TIP CTD 4 INCH (ELECTRODE) ×2 IMPLANT
ELECT REM PT RETURN 9FT ADLT (ELECTROSURGICAL) ×2
ELECTRODE REM PT RTRN 9FT ADLT (ELECTROSURGICAL) ×1 IMPLANT
ELIMINATOR HOLE APEX DEPUY (Hips) IMPLANT
EVACUATOR 1/8 PVC DRAIN (DRAIN) ×2 IMPLANT
FACESHIELD LNG OPTICON STERILE (SAFETY) ×8 IMPLANT
GAUZE SPONGE 2X2 8PLY STRL LF (GAUZE/BANDAGES/DRESSINGS) IMPLANT
GLOVE BIOGEL PI IND STRL 8 (GLOVE) ×1 IMPLANT
GLOVE BIOGEL PI INDICATOR 8 (GLOVE) ×2
GLOVE ECLIPSE 8.0 STRL XLNG CF (GLOVE) ×8 IMPLANT
GOWN PREVENTION PLUS LG XLONG (DISPOSABLE) ×4 IMPLANT
GOWN STRL REIN XL XLG (GOWN DISPOSABLE) ×4 IMPLANT
IMMOBILIZER KNEE 20 (SOFTGOODS) ×4
IMMOBILIZER KNEE 20 THIGH 36 (SOFTGOODS) IMPLANT
KIT BASIN OR (CUSTOM PROCEDURE TRAY) ×2 IMPLANT
MANIFOLD NEPTUNE II (INSTRUMENTS) ×2 IMPLANT
NEEDLE HYPO 22GX1.5 SAFETY (NEEDLE) ×2 IMPLANT
PACK TOTAL JOINT (CUSTOM PROCEDURE TRAY) ×2 IMPLANT
POSITIONER SURGICAL ARM (MISCELLANEOUS) ×2 IMPLANT
SPONGE GAUZE 2X2 STER 10/PKG (GAUZE/BANDAGES/DRESSINGS) ×1
SPONGE LAP 18X18 X RAY DECT (DISPOSABLE) ×2 IMPLANT
SPONGE SURGIFOAM ABS GEL 100 (HEMOSTASIS) ×2 IMPLANT
STAPLER VISISTAT 35W (STAPLE) ×2 IMPLANT
SUCTION FRAZIER TIP 10 FR DISP (SUCTIONS) ×2 IMPLANT
SUT MNCRL AB 4-0 PS2 18 (SUTURE) ×1 IMPLANT
SUT VIC AB 0 CT1 27 (SUTURE)
SUT VIC AB 0 CT1 27XBRD ANTBC (SUTURE) ×2 IMPLANT
SUT VIC AB 1 CT1 27 (SUTURE) ×8
SUT VIC AB 1 CT1 27XBRD ANTBC (SUTURE) ×5 IMPLANT
SUT VIC AB 2-0 CT1 27 (SUTURE) ×4
SUT VIC AB 2-0 CT1 TAPERPNT 27 (SUTURE) IMPLANT
SYR 20CC LL (SYRINGE) ×2 IMPLANT
TOWEL OR 17X26 10 PK STRL BLUE (TOWEL DISPOSABLE) ×4 IMPLANT
TRAY FOLEY CATH 14FRSI W/METER (CATHETERS) ×2 IMPLANT
WATER STERILE IRR 1500ML POUR (IV SOLUTION) ×3 IMPLANT

## 2012-07-29 NOTE — Brief Op Note (Signed)
07/29/2012  8:50 AM  PATIENT:  Jamie Gilmore  66 y.o. female  PRE-OPERATIVE DIAGNOSIS:  AVASCULA NECROSIS  LEFT HIP   POST-OPERATIVE DIAGNOSIS:  AVASCULA NECROSIS  LEFT HIP   PROCEDURE:  Procedure(s) (LRB) with comments: TOTAL HIP ARTHROPLASTY (Left),Complex.  SURGEON:  Surgeon(s) and Role:    * Jacki Cones, MD - Primary    * Shelda Pal, MD - Assisting  PHYSICIAN ASSISTANT: Leilani Able PA  ASSISTANTS: Durene Romans MD   ANESTHESIA:   general  EBL:  Total I/O In: 2000 [I.V.:2000] Out: 325 [Urine:150; Blood:175]  BLOOD ADMINISTERED:none  DRAINS: (ONe) Hemovact drain(s) in the Left HIP. with  Suction Open   LOCAL MEDICATIONS USED:  BUPIVICAINE 20cc mixed with 20cc Normal Saline  SPECIMEN:  No Specimen  DISPOSITION OF SPECIMEN:  N/A  COUNTS:  YES  TOURNIQUET:  * No tourniquets in log *  DICTATION: .Other Dictation: Dictation Number [id number]  PLAN OF CARE: Admit to inpatient   PATIENT DISPOSITION:  Stable in OR.   Delay start of Pharmacological VTE agent (>24hrs) due to surgical blood loss or risk of bleeding: yes

## 2012-07-29 NOTE — Transfer of Care (Signed)
Immediate Anesthesia Transfer of Care Note  Patient: Jamie Gilmore  Procedure(s) Performed: Procedure(s) (LRB) with comments: TOTAL HIP ARTHROPLASTY (Left)  Patient Location: PACU  Anesthesia Type:General  Level of Consciousness: awake, alert , oriented and patient cooperative  Airway & Oxygen Therapy: Patient Spontanous Breathing and Patient connected to nasal cannula oxygen  Post-op Assessment: Report given to PACU RN, Post -op Vital signs reviewed and stable and Patient moving all extremities  Post vital signs: Reviewed and stable  Complications: No apparent anesthesia complications

## 2012-07-29 NOTE — Op Note (Signed)
NAMEJENESE, MISCHKE NO.:  [id number]  MEDICAL RECORD NO.:  [id number]  LOCATION:  1601                         FACILITY:  [facility]  PHYSICIAN:  Georges Lynch. Xcaret Morad, M.D.DATE OF BIRTH:  05-14-1946  DATE OF PROCEDURE:  07/29/2012 DATE OF DISCHARGE:                              OPERATIVE REPORT   SURGEON:  Georges Lynch. Muneer Leider, MD  ASSISTANTS: 1. Madlyn Frankel Charlann Boxer, MD 2. Jaquelyn Bitter. Chabon, PA  PREOPERATIVE DIAGNOSIS:  Avascular necrosis to left femoral head.  POSTOPERATIVE DIAGNOSIS:  Avascular necrosis to left femoral head.  OPERATION:  Left total hip arthroplasty utilizing the DePuy system.  I utilized a size 4 high offset Tri-Lock stem with a +1.5 ceramic head, 36 mm in diameter.  The cup was a pinnacle cup, size 52 mm outside diameter with 1 screw.  The insert was a 36 mm diameter AltrX polyethylene insert.  PROCEDURE:  Under general anesthesia with the patient on the left side up, routine orthopedic prep and draping of the left hip was carried out. She had 2 g of IV Ancef.  Note as I said, the appropriate time-out was carried out prior to surgery.  I also marked the appropriate left hip in the holding area.  A posterolateral approach to hip was carried out. Bleeders were identified and cauterized.  I then inserted self-retaining retractors.  I identified the iliotibial band, made an incision and then dissected a small part of the gluteal muscle by blunt dissection.  At this time, I partially detached external rotators to cauterize the bleeders.  Great care was taken not to injure the underlying sciatic nerve.  I then went down, did a capsulectomy, dislocated the head, amputated femoral head to appropriate neck length.  At this time, I utilized my box osteotome to remove the cancellous bone from the trochanter.  I then used my widening reamer than a canal finer.  I then rasped the femoral canal up to a size 4 Tri-Lock stem.  I thoroughly irrigated out the canal  several times, packed the canal open and next attention was directed to the acetabulum.  I completed my capsulectomy and then reamed the acetabulum up to a size 51 for a 52 mm cup.  A 52 mm pinnacle cup then was inserted.  We had good stability.  We utilized the Charnley guide and made sure we had good position.  At this point, one screw was utilized for fixation purposes.  I then inserted my AltrX cup inside diameter 36 mm.  The cup was secured.  We then went through our leg length measurements again and selected a +1.5 ceramic ball.  At this time, we selected a high offset Tri-Lock stem and inserted that after the sponge was removed from the canal.  I then inserted our permanent 1.5 ceramic head 36 mm diameter, reduced the hip and had excellent stability in all planes. There was no impingement and we had good stability.  I thoroughly irrigated out the area and reapproximated the wound in usual fashion over Hemovac drain.  I did inject 20 mL mixture of Exparel and normal saline.  Subcu was closed with a running locking subcu suture.  It was a Monocryl  suture we used.  Sterile dressings were applied.          ______________________________ Georges Lynch Darrelyn Hillock, M.D.     RAG/MEDQ  D:  07/29/2012  T:  07/29/2012  Job:  [id number]

## 2012-07-29 NOTE — Interval H&P Note (Signed)
History and Physical Interval Note:  07/29/2012 7:11 AM  Jamie Gilmore  has presented today for surgery, with the diagnosis of AVASCULA NECROSIS  LEFT HIP   The various methods of treatment have been discussed with the patient and family. After consideration of risks, benefits and other options for treatment, the patient has consented to  Procedure(s) (LRB) with comments: TOTAL HIP ARTHROPLASTY (Left) as a surgical intervention .  The patient's history has been reviewed, patient examined, no change in status, stable for surgery.  I have reviewed the patient's chart and labs.  Questions were answered to the patient's satisfaction.     Courney Garrod A

## 2012-07-29 NOTE — Anesthesia Procedure Notes (Addendum)
Performed by: Durward Parcel A   Procedure Name: Intubation Performed by: Durward Parcel A Pre-anesthesia Checklist: Patient identified, Emergency Drugs available, Suction available and Patient being monitored Patient Re-evaluated:Patient Re-evaluated prior to inductionOxygen Delivery Method: Circle system utilized Preoxygenation: Pre-oxygenation with 100% oxygen Intubation Type: Rapid sequence Ventilation: Mask ventilation without difficulty Tube type: Oral Tube size: 7.5 mm Number of attempts: 1 Airway Equipment and Method: Stylet Placement Confirmation: ETT inserted through vocal cords under direct vision,  positive ETCO2 and breath sounds checked- equal and bilateral Secured at: 21 cm Tube secured with: Tape Dental Injury: Teeth and Oropharynx as per pre-operative assessment

## 2012-07-29 NOTE — Anesthesia Postprocedure Evaluation (Signed)
  Anesthesia Post-op Note  Patient: Jamie Gilmore  Procedure(s) Performed: Procedure(s) (LRB): TOTAL HIP ARTHROPLASTY (Left)  Patient Location: PACU  Anesthesia Type: General  Level of Consciousness: awake and alert   Airway and Oxygen Therapy: Patient Spontanous Breathing  Post-op Pain: mild  Post-op Assessment: Post-op Vital signs reviewed, Patient's Cardiovascular Status Stable, Respiratory Function Stable, Patent Airway and No signs of Nausea or vomiting  Last Vitals:  Filed Vitals:   07/29/12 1000  BP: 110/75  Pulse: 79  Temp:   Resp: 17    Post-op Vital Signs: stable   Complications: No apparent anesthesia complications

## 2012-07-30 ENCOUNTER — Encounter (HOSPITAL_COMMUNITY): Payer: Self-pay | Admitting: Orthopedic Surgery

## 2012-07-30 LAB — BASIC METABOLIC PANEL
Calcium: 8.4 mg/dL (ref 8.4–10.5)
GFR calc non Af Amer: 90 mL/min — ABNORMAL LOW (ref >90)
Glucose, Bld: 104 mg/dL — ABNORMAL HIGH (ref 70–99)
Sodium: 134 mEq/L — ABNORMAL LOW (ref 135–145)

## 2012-07-30 LAB — CBC
Hemoglobin: 11 g/dL — ABNORMAL LOW (ref 12.0–15.0)
MCH: 30.5 pg (ref 26.0–34.0)
MCHC: 33.1 g/dL (ref 30.0–36.0)
Platelets: 289 10*3/uL (ref 150–400)
RDW: 15.2 % (ref 11.5–15.5)

## 2012-07-30 NOTE — Evaluation (Signed)
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  She needed +2 assistance at eval for safety but should progress well.  Pt is appropriate for skilled OT to increase independence with adls.  Goals in acute are min A level.      OT Assessment  Patient needs continued OT Services    Follow Up Recommendations  SNF    Barriers to Discharge      Equipment Recommendations  3 in 1 bedside comode    Recommendations for Other Services    Frequency  Min 2X/week    Precautions / Restrictions Precautions Precautions: Posterior Hip Restrictions Weight Bearing Restrictions: Yes LLE Weight Bearing: Partial weight bearing   Pertinent Vitals/Pain L hip 5/10 with movement.  Pt felt lightheaded after standing several minutes.  Repositioned in chair    ADL  Grooming: Set up Where Assessed - Grooming: Unsupported sitting Upper Body Bathing: Supervision/safety Where Assessed - Upper Body Bathing: Supported sit to stand Lower Body Bathing: +2 Total assistance Lower Body Bathing: Patient Percentage: 40% Where Assessed - Lower Body Bathing: Supported sit to stand Upper Body Dressing: Supervision/safety Where Assessed - Upper Body Dressing: Unsupported sitting Lower Body Dressing: +2 Total assistance Lower Body Dressing: Patient Percentage: 30% Where Assessed - Lower Body Dressing: Supported sit to stand Toilet Transfer: +2 Total assistance Toilet Transfer: Patient Percentage: 70% Toilet Transfer Method: Sit to stand (bed to chair:  took steps in room) Transfers/Ambulation Related to ADLs: +2 for safety; pt a little shaky ADL Comments: Educated on AE and demonstrated.  Pt did not practice with this yet.    OT Diagnosis: Generalized weakness  OT Problem  List: Decreased strength;Decreased activity tolerance;Decreased knowledge of use of DME or AE;Decreased knowledge of precautions;Pain OT Treatment Interventions: Self-care/ADL training;DME and/or AE instruction;Patient/family education   OT Goals Acute Rehab OT Goals OT Goal Formulation: With patient Time For Goal Achievement: 08/06/12 Potential to Achieve Goals: Good ADL Goals Pt Will Perform Lower Body Bathing: with min assist;Sit to stand from chair;with adaptive equipment ADL Goal: Lower Body Bathing - Progress: Goal set today Pt Will Perform Lower Body Dressing: with min assist;Sit to stand from chair;with adaptive equipment ADL Goal: Lower Body Dressing - Progress: Goal set today Pt Will Transfer to Toilet: with min assist;Ambulation;3-in-1;Maintaining hip precautions;Maintaining back safety precautions;with cueing (comment type and amount) (min cues) ADL Goal: Toilet Transfer - Progress: Goal set today Pt Will Perform Toileting - Hygiene: with min assist;Standing at 3-in-1/toilet (min guard, and clothes management) ADL Goal: Toileting - Hygiene - Progress: Goal set today  Visit Information  Last OT Received On: 07/30/12 Assistance Needed: +2 PT/OT Co-Evaluation/Treatment: Yes    Subjective Data  Subjective: I feel a little lightheaded Patient Stated Goal: rehab then home   Prior Functioning     Home Living Additional Comments: plans snf Communication Communication: No difficulties         Vision/Perception     Cognition  Overall Cognitive Status: Appears within functional limits for tasks assessed/performed Arousal/Alertness: Awake/alert Orientation Level: Appears intact for tasks assessed Behavior During Session: [facility] for tasks performed    Extremity/Trunk Assessment Right Upper Extremity Assessment RUE ROM/Strength/Tone: Within functional levels (states UE status variable.  Today strength 4-/5 bil) Left Upper Extremity Assessment LUE ROM/Strength/Tone:  Within functional levels  Mobility Transfers Transfers: Sit to Stand Sit to Stand: From elevated surface;With upper extremity assist;From bed;1: +2 Total assist Sit to Stand: Patient Percentage: 70%     Shoulder Instructions     Exercise     Balance     End of Session OT - End of Session Activity Tolerance: Patient limited by pain;Patient limited by fatigue (lightheaded) Patient left: in chair;with call bell/phone within reach  GO     [facility] 07/30/2012, 11:36 AM Marica Otter, OTR/L [phone] 07/30/2012

## 2012-07-30 NOTE — Progress Notes (Signed)
Subjective: Doing very well today.Will make plans for SNF   Objective: Vital signs in last 24 hours: Temp:  [97.8 F (36.6 C)-99.2 F (37.3 C)] 98.7 F (37.1 C) (12/24 0626) Pulse Rate:  [77-103] 92  (12/24 0626) Resp:  [10-17] 16  (12/24 0626) BP: (110-134)/(75-88) 132/86 mmHg (12/24 0626) SpO2:  [94 %-100 %] 97 % (12/24 0626) Weight:  [100.699 kg (222 lb)] 100.699 kg (222 lb) (12/23 1019)  Intake/Output from previous day: 12/23 0701 - 12/24 0700 In: 4690 [P.O.:840; I.V.:3700; IV Piggyback:150] Out: 2140 [Urine:1615; Drains:350; Blood:175] Intake/Output this shift: Total I/O In: 1800 [P.O.:600; I.V.:1100; IV Piggyback:100] Out: 1200 [Urine:1050; Drains:150]   Basename 07/30/12 0437  HGB 11.0*    Basename 07/30/12 0437  WBC 7.6  RBC 3.61*  HCT 33.2*  PLT 289    Basename 07/30/12 0437  NA 134*  K 3.2*  CL 97  CO2 31  BUN 9  CREATININE 0.70  GLUCOSE 104*  CALCIUM 8.4   No results found for this basename: LABPT:2,INR:2 in the last 72 hours  Dorsiflexion/Plantar flexion intact No cellulitis present  Assessment/Plan: SNF Thursday since    Thanh Mottern A 07/30/2012, 6:46 AM

## 2012-07-30 NOTE — Progress Notes (Signed)
Utilization review completed.  

## 2012-07-30 NOTE — Progress Notes (Signed)
Patient expressed interest in [facility] as it is close to their house. CSW awaiting call back from [facility] [place] re: bed offer/availability. Anticipating discharge Thursday.   Clinical Social Work Department CLINICAL SOCIAL WORK PLACEMENT NOTE 07/30/2012  Patient:  MONIA, TIMMERS  Account Number:  [id number] Admit date:  07/29/2012  Clinical Social Worker:  Orpah Greek  Date/time:  07/30/2012 12:56 PM  Clinical Social Work is seeking post-discharge placement for this patient at the following level of care:   SKILLED NURSING   (*CSW will update this form in Epic as items are completed)   07/30/2012  Patient/family provided with [facility] Department of Clinical Social Work's list of facilities offering this level of care within the geographic area requested by the patient (or if unable, by the patient's family).  07/30/2012  Patient/family informed of their freedom to choose among providers that offer the needed level of care, that participate in Medicare, Medicaid or managed care program needed by the patient, have an available bed and are willing to accept the patient.  07/30/2012  Patient/family informed of [facility]' ownership interest in [facility], as well as of the fact that they are under no obligation to receive care at this facility.  PASARR submitted to EDS on 07/30/2012 PASARR number received from EDS on 07/30/2012  FL2 transmitted to all facilities in geographic area requested by pt/family on  07/30/2012 FL2 transmitted to all facilities within larger geographic area on   Patient informed that his/her managed care company has contracts with or will negotiate with  certain facilities, including the following:     Patient/family informed of bed offers received:   Patient chooses bed at  Physician recommends and patient chooses bed at    Patient to be transferred to  on   Patient to be transferred to facility by   The following physician  request were entered in Epic:   Additional Comments:  Unice Bailey, LCSW [facility] Clinical Social Worker cell #: [phone]

## 2012-07-30 NOTE — Care Management Note (Signed)
    Page 1 of 1   07/30/2012     10:49:16 AM   CARE MANAGEMENT NOTE 07/30/2012  Patient:  Jamie Gilmore, Jamie Gilmore   Account Number:  [id number]  Date Initiated:  07/30/2012  Documentation initiated by:  Lorenda Ishihara  Subjective/Objective Assessment:   66 yo female admitted s/p left THA. PTA lived at home with significant other/friend.     Action/Plan:   Plan for SNF for rehab   Anticipated DC Date:  08/01/2012   Anticipated DC Plan:  SKILLED NURSING FACILITY  In-house referral  Clinical Social Worker      DC Planning Services  CM consult      Choice offered to / List presented to:             Status of service:  Completed, signed off Medicare Important Message given?   (If response is "NO", the following Medicare IM given date fields will be blank) Date Medicare IM given:   Date Additional Medicare IM given:    Discharge Disposition:  SKILLED NURSING FACILITY  Per UR Regulation:  Reviewed for med. necessity/level of care/duration of stay  If discussed at Long Length of Stay Meetings, dates discussed:    Comments:

## 2012-07-30 NOTE — Progress Notes (Signed)
Clinical Social Work Department BRIEF PSYCHOSOCIAL ASSESSMENT 07/30/2012  Patient:  Jamie Gilmore, Jamie Gilmore     Account Number:  [id number]     Admit date:  07/29/2012  Clinical Social Worker:  Orpah Greek  Date/Time:  07/30/2012 12:34 PM  Referred by:  Physician  Date Referred:  07/30/2012 Referred for  SNF Placement   Other Referral:   Interview type:  Patient Other interview type:   and significant other    PSYCHOSOCIAL DATA Living Status:  FRIEND(S) Admitted from facility:   Level of care:   Primary support name:  Leavy Cella (significant other) h#: [phone] c#: [phone] Primary support relationship to patient:  FRIEND Degree of support available:   good    CURRENT CONCERNS Current Concerns  Post-Acute Placement   Other Concerns:    SOCIAL WORK ASSESSMENT / PLAN CSW spoke with patient & significant other at bedside re: discharge planning. Patient was admitted from home with significant other, note PT/MD recommending SNF at discharge.   Assessment/plan status:  Information/Referral to [facility] Other assessment/ plan:   Information/referral to community resources:   CSW completed FL2 and faxed information out to [facility], will follow-up with bed offers when available.    PATIENT'S/FAMILY'S RESPONSE TO PLAN OF CARE: Patient is agreeable to plan for SNF - expressed interest in [facility] as it is close to their house. CSW awaiting call back from [facility] @ [facility] re: bed offer/availability. Anticipating discharge Thursday.        Unice Bailey, LCSW [facility] Clinical Social Worker cell #: [phone]

## 2012-07-30 NOTE — Progress Notes (Signed)
Physical Therapy Treatment Patient Details Name: Jamie Gilmore MRN: [id number] DOB: [birth year] Today's Date: 07/30/2012 Time: 4696-2952 PT Time Calculation (min): 19 min  PT Assessment / Plan / Recommendation Comments on Treatment Session       Follow Up Recommendations  SNF     Does the patient have the potential to tolerate intense rehabilitation     Barriers to Discharge        Equipment Recommendations  None recommended by PT    Recommendations for Other Services OT consult  Frequency 7X/week   Plan Discharge plan remains appropriate    Precautions / Restrictions Precautions Precautions: Posterior Hip Precaution Comments: pt recalls 1/3 THP Restrictions Weight Bearing Restrictions: Yes LLE Weight Bearing: Partial weight bearing   Pertinent Vitals/Pain     Mobility  Bed Mobility Bed Mobility: Sit to Supine Sit to Supine: 1: +2 Total assist Sit to Supine: Patient Percentage: 60% Details for Bed Mobility Assistance: Increased time; cues for sequence, use of R LE to self assist and adherence to THP Transfers Transfers: Sit to Stand;Stand to Sit Sit to Stand: 3: Mod assist;With armrests;From chair/3-in-1 Stand to Sit: 3: Mod assist;With upper extremity assist;To bed;To elevated surface Details for Transfer Assistance: cues for use of UEs and for LE management Ambulation/Gait Ambulation/Gait Assistance: 1: +2 Total assist Ambulation/Gait: Patient Percentage: 70% Ambulation Distance (Feet): 6 Feet Assistive device: Rolling walker Ambulation/Gait Assistance Details: cues for sequence, posture and position from RW Gait Pattern: Step-to pattern Stairs: No    Exercises     PT Diagnosis:    PT Problem List:   PT Treatment Interventions:     PT Goals Acute Rehab PT Goals PT Goal Formulation: With patient Time For Goal Achievement: 08/03/12 Potential to Achieve Goals: Good Pt will go Supine/Side to Sit: with min assist PT Goal: Supine/Side to Sit -  Progress: Progressing toward goal Pt will go Sit to Supine/Side: with min assist PT Goal: Sit to Supine/Side - Progress: Progressing toward goal Pt will go Sit to Stand: with min assist PT Goal: Sit to Stand - Progress: Progressing toward goal Pt will go Stand to Sit: with min assist PT Goal: Stand to Sit - Progress: Progressing toward goal Pt will Ambulate: 51 - 150 feet;with min assist;with rolling walker PT Goal: Ambulate - Progress: Progressing toward goal  Visit Information  Last PT Received On: 07/30/12 Assistance Needed: +2    Subjective Data  Subjective: I think I'm ready to get back to bed Patient Stated Goal: Resume previous lifestyle with decreased pain   Cognition  Overall Cognitive Status: Appears within functional limits for tasks assessed/performed Arousal/Alertness: Awake/alert Orientation Level: Appears intact for tasks assessed Behavior During Session: [facility] for tasks performed    Balance     End of Session PT - End of Session Equipment Utilized During Treatment: Gait belt Activity Tolerance: Patient tolerated treatment well Patient left: in bed;with call bell/phone within reach Nurse Communication: Mobility status   GP     Jamie Gilmore 07/30/2012, 4:28 PM

## 2012-07-30 NOTE — Evaluation (Signed)
Physical Therapy Evaluation Patient Details Name: Jamie Gilmore MRN: [id number] DOB: [birth year] Today's Date: 07/30/2012 Time: 1020-1055 PT Time Calculation (min): 35 min  PT Assessment / Plan / Recommendation Clinical Impression  Pt s/p L THR presents with decreased L LE strength/ROM, post op post THR, post op pain , and PWB status on L LE limiting functional mobility    PT Assessment  Patient needs continued PT services    Follow Up Recommendations  SNF    Does the patient have the potential to tolerate intense rehabilitation      Barriers to Discharge Inaccessible home environment      Equipment Recommendations  None recommended by PT    Recommendations for Other Services OT consult   Frequency 7X/week    Precautions / Restrictions Precautions Precautions: Posterior Hip Precaution Comments: sign hung in room Restrictions Weight Bearing Restrictions: Yes LLE Weight Bearing: Partial weight bearing   Pertinent Vitals/Pain 6/10; premedicated; ice pack provided      Mobility  Bed Mobility Bed Mobility: Supine to Sit Supine to Sit: 1: +2 Total assist Supine to Sit: Patient Percentage: 70% Details for Bed Mobility Assistance: Increased time; cues for sequence, use of R LE to self assist and adherence to THP Transfers Transfers: Sit to Stand;Stand to Sit Sit to Stand: From elevated surface;With upper extremity assist;From bed;1: +2 Total assist Sit to Stand: Patient Percentage: 70% Stand to Sit: 1: +2 Total assist Stand to Sit: Patient Percentage: 70% Details for Transfer Assistance: cues for use of UEs and for LE management Ambulation/Gait Ambulation/Gait Assistance: 1: +2 Total assist Ambulation/Gait: Patient Percentage: 70% Ambulation Distance (Feet): 8 Feet Assistive device: Rolling walker Ambulation/Gait Assistance Details: cues for posture, sequence, position from RW and ER on R Gait Pattern: Step-to pattern Stairs: No    Shoulder Instructions      Exercises Total Joint Exercises Ankle Circles/Pumps: AROM;10 reps;Supine;Both Quad Sets: AROM;10 reps;Both;Supine Heel Slides: AAROM;Supine;Left;15 reps Hip ABduction/ADduction: AAROM;10 reps;Left;Supine   PT Diagnosis: Difficulty walking  PT Problem List: Decreased strength;Decreased range of motion;Decreased activity tolerance;Decreased mobility;Decreased knowledge of use of DME;Obesity;Decreased knowledge of precautions;Pain PT Treatment Interventions: DME instruction;Gait training;Stair training;Functional mobility training;Therapeutic activities;Therapeutic exercise;Patient/family education   PT Goals Acute Rehab PT Goals PT Goal Formulation: With patient Time For Goal Achievement: 08/03/12 Potential to Achieve Goals: Good Pt will go Supine/Side to Sit: with min assist Pt will go Sit to Supine/Side: with min assist Pt will go Sit to Stand: with min assist Pt will go Stand to Sit: with min assist Pt will Ambulate: 51 - 150 feet;with min assist;with rolling walker  Visit Information  Last PT Received On: 07/30/12 Assistance Needed: +2 PT/OT Co-Evaluation/Treatment: Yes    Subjective Data  Subjective: The Dr wants me to go to rehab because I have so many stairs at home Patient Stated Goal: Resume previous lifestyle with decreased pain   Prior Functioning  Home Living Lives With: Significant other Available Help at Discharge: Family Type of Home: Apartment Home Access: Stairs to enter Entrance Stairs-Number of Steps: 20 Entrance Stairs-Rails: Right Home Adaptive Equipment: Walker - rolling Additional Comments: plans snf Prior Function Level of Independence: Independent with assistive device(s) Able to Take Stairs?: Yes Vocation: Retired Musician: No difficulties    Cognition  Overall Cognitive Status: Appears within functional limits for tasks assessed/performed Arousal/Alertness: Awake/alert Orientation Level: Appears intact for tasks  assessed Behavior During Session: [facility] for tasks performed    Extremity/Trunk Assessment Right Upper Extremity Assessment RUE ROM/Strength/Tone: Within functional levels (states  UE status variable.  Today strength 4-/5 bil) Left Upper Extremity Assessment LUE ROM/Strength/Tone: Within functional levels Right Lower Extremity Assessment RLE ROM/Strength/Tone: WFL for tasks assessed Left Lower Extremity Assessment LLE ROM/Strength/Tone: Deficits LLE ROM/Strength/Tone Deficits: hip strength 2/5 with AAROM to 75 hip flex and 10 abd   Balance    End of Session PT - End of Session Equipment Utilized During Treatment: Gait belt Activity Tolerance: Patient tolerated treatment well Patient left: in chair;with call bell/phone within reach;with family/visitor present Nurse Communication: Mobility status  GP     Jamie Gilmore 07/30/2012, 12:11 PM

## 2012-07-31 LAB — BASIC METABOLIC PANEL
BUN: 5 mg/dL — ABNORMAL LOW (ref 6–23)
Chloride: 95 mEq/L — ABNORMAL LOW (ref 96–112)
GFR calc Af Amer: >90 mL/min (ref >90)
Potassium: 3.1 mEq/L — ABNORMAL LOW (ref 3.5–5.1)

## 2012-07-31 LAB — CBC
HCT: 31.5 % — ABNORMAL LOW (ref 36.0–46.0)
Platelets: 283 10*3/uL (ref 150–400)
RDW: 14.8 % (ref 11.5–15.5)
WBC: 8.3 10*3/uL (ref 4.0–10.5)

## 2012-07-31 MED ORDER — OXYCODONE HCL 5 MG PO TABS
5.0000 mg | ORAL_TABLET | Freq: Once | ORAL | Status: AC
  Administered 2012-07-31: 10 mg via ORAL
  Filled 2012-07-31: qty 2

## 2012-07-31 NOTE — Progress Notes (Signed)
   Subjective: 2 Days Post-Op Procedure(s) (LRB): TOTAL HIP ARTHROPLASTY (Left)   Patient reports pain as mild, pain well controlled. No events throughout the night.   Objective:   VITALS:   Filed Vitals:   07/31/12 0540  BP: 107/69  Pulse: 88  Temp: 98.9 F (37.2 C)  Resp: 16    Neurovascular intact Dorsiflexion/Plantar flexion intact Incision: dressing C/D/I No cellulitis present Compartment soft  LABS  Basename 07/31/12 0418 07/30/12 0437  HGB 10.4* 11.0*  HCT 31.5* 33.2*  WBC 8.3 7.6  PLT 283 289     Basename 07/31/12 0418 07/30/12 0437  NA 135 134*  K 3.1* 3.2*  BUN 5* 9  CREATININE 0.60 0.70  GLUCOSE 107* 104*     Assessment/Plan: 2 Days Post-Op Procedure(s) (LRB): TOTAL HIP ARTHROPLASTY (Left)  Up with therapy Discharge to SNF tomorrow, if she continues on current course.  They have decided on SNF and will communicate that to the social worker.   Anastasio Auerbach Ahren Pettinger   PAC  07/31/2012, 10:04 AM

## 2012-07-31 NOTE — Progress Notes (Signed)
Physical Therapy Treatment Patient Details Name: Jamie Gilmore MRN: [id number] DOB: 1946/08/04 Today's Date: 07/31/2012 Time: 3664-4034 PT Time Calculation (min): 33 min  PT Assessment / Plan / Recommendation Comments on Treatment Session       Follow Up Recommendations  SNF     Does the patient have the potential to tolerate intense rehabilitation     Barriers to Discharge        Equipment Recommendations  None recommended by PT    Recommendations for Other Services OT consult  Frequency 7X/week   Plan Discharge plan remains appropriate    Precautions / Restrictions Precautions Precautions: Posterior Hip Precaution Comments: pt recalls 1/3 THP Restrictions Weight Bearing Restrictions: Yes LLE Weight Bearing: Partial weight bearing LLE Partial Weight Bearing Percentage or Pounds: 50   Pertinent Vitals/Pain 6/10; premedicated, ice packs provided    Mobility  Bed Mobility Bed Mobility: Supine to Sit Supine to Sit: 3: Mod assist Details for Bed Mobility Assistance: Increased time; cues for sequence, use of R LE to self assist and adherence to THP Transfers Transfers: Sit to Stand;Stand to Sit Sit to Stand: 3: Mod assist;From bed;From chair/3-in-1;With upper extremity assist;4: Min assist;With armrests Stand to Sit: 3: Mod assist;With armrests;To chair/3-in-1;4: Min assist;With upper extremity assist Details for Transfer Assistance: cues for use of UEs and for LE management Ambulation/Gait Ambulation/Gait Assistance: 4: Min assist;3: Mod assist Ambulation Distance (Feet): 59 Feet (and 20) Assistive device: Rolling walker Ambulation/Gait Assistance Details: cues for posture, sequence, stride length, position from RW and ER on L Gait Pattern: Step-to pattern    Exercises     PT Diagnosis:    PT Problem List:   PT Treatment Interventions:     PT Goals Acute Rehab PT Goals PT Goal Formulation: With patient Time For Goal Achievement: 08/03/12 Potential to  Achieve Goals: Good Pt will go Supine/Side to Sit: with min assist PT Goal: Supine/Side to Sit - Progress: Progressing toward goal Pt will go Sit to Supine/Side: with min assist PT Goal: Sit to Supine/Side - Progress: Progressing toward goal Pt will go Sit to Stand: with min assist PT Goal: Sit to Stand - Progress: Progressing toward goal Pt will go Stand to Sit: with min assist PT Goal: Stand to Sit - Progress: Progressing toward goal Pt will Ambulate: 51 - 150 feet;with min assist;with rolling walker PT Goal: Ambulate - Progress: Progressing toward goal  Visit Information  Last PT Received On: 07/31/12 Assistance Needed: +1    Subjective Data  Subjective: I really need to use the bathroom Patient Stated Goal: Resume previous lifestyle with decreased pain   Cognition  Overall Cognitive Status: Appears within functional limits for tasks assessed/performed Arousal/Alertness: Awake/alert Orientation Level: Appears intact for tasks assessed Behavior During Session: [facility] for tasks performed    Balance     End of Session PT - End of Session Equipment Utilized During Treatment: Gait belt Activity Tolerance: Patient tolerated treatment well Patient left: in chair;with call bell/phone within reach;with family/visitor present Nurse Communication: Mobility status   GP     Ronetta Molla 07/31/2012, 2:07 PM

## 2012-07-31 NOTE — Progress Notes (Signed)
Spoke to [facility], [place] informed him patient had vicodin this am but c/o generalized pain, orders given for one time dose of oxycodone, states to also give a robaxin. Informed Pa that IV pulled out during night, Molli Hazard states ok to leave out for anticipated discharge tomorrow.

## 2012-07-31 NOTE — Progress Notes (Signed)
Physical Therapy Treatment Patient Details Name: Jamie Gilmore MRN: [id number] DOB: [birth year] Today's Date: 07/31/2012 Time: 1250-1303 PT Time Calculation (min): 13 min  PT Assessment / Plan / Recommendation Comments on Treatment Session       Follow Up Recommendations  SNF     Does the patient have the potential to tolerate intense rehabilitation     Barriers to Discharge        Equipment Recommendations  None recommended by PT    Recommendations for Other Services OT consult  Frequency 7X/week   Plan Discharge plan remains appropriate    Precautions / Restrictions Precautions Precautions: Posterior Hip Precaution Comments: pt recalls 1/3 THP Restrictions Weight Bearing Restrictions: Yes LLE Weight Bearing: Partial weight bearing LLE Partial Weight Bearing Percentage or Pounds: 50   Pertinent Vitals/Pain     Mobility  Bed Mobility Bed Mobility: Sit to Supine Supine to Sit: 3: Mod assist Sit to Supine: 4: Min assist;3: Mod assist Details for Bed Mobility Assistance: Increased time; cues for sequence, use of R LE to self assist and adherence to THP Transfers Transfers: Sit to Stand;Stand to Sit Sit to Stand: 4: Min assist;With upper extremity assist;From chair/3-in-1 Stand to Sit: 4: Min assist;To bed;With upper extremity assist Details for Transfer Assistance: cues for use of UEs and for LE management Ambulation/Gait Ambulation/Gait Assistance: 4: Min assist Ambulation Distance (Feet): 18 Feet Assistive device: Rolling walker Ambulation/Gait Assistance Details: cues for sequence and position from RW Gait Pattern: Step-to pattern    Exercises     PT Diagnosis:    PT Problem List:   PT Treatment Interventions:     PT Goals Acute Rehab PT Goals PT Goal Formulation: With patient Time For Goal Achievement: 08/03/12 Potential to Achieve Goals: Good Pt will go Supine/Side to Sit: with min assist PT Goal: Supine/Side to Sit - Progress: Progressing toward  goal Pt will go Sit to Supine/Side: with min assist PT Goal: Sit to Supine/Side - Progress: Progressing toward goal Pt will go Sit to Stand: with min assist PT Goal: Sit to Stand - Progress: Progressing toward goal Pt will go Stand to Sit: with min assist PT Goal: Stand to Sit - Progress: Progressing toward goal Pt will Ambulate: 51 - 150 feet;with min assist;with rolling walker PT Goal: Ambulate - Progress: Progressing toward goal  Visit Information  Last PT Received On: 07/31/12 Assistance Needed: +1    Subjective Data  Subjective: I really need to use the bathroom Patient Stated Goal: Resume previous lifestyle with decreased pain   Cognition  Overall Cognitive Status: Appears within functional limits for tasks assessed/performed Arousal/Alertness: Awake/alert Orientation Level: Appears intact for tasks assessed Behavior During Session: [facility] for tasks performed    Balance     End of Session PT - End of Session Equipment Utilized During Treatment: Gait belt Activity Tolerance: Patient tolerated treatment well Patient left: in bed;with call bell/phone within reach Nurse Communication: Mobility status   GP     Jamie Gilmore 07/31/2012, 2:10 PM

## 2012-08-01 DIAGNOSIS — D62 Acute posthemorrhagic anemia: Secondary | ICD-10-CM

## 2012-08-01 HISTORY — DX: Acute posthemorrhagic anemia: D62

## 2012-08-01 LAB — CBC
HCT: 30 % — ABNORMAL LOW (ref 36.0–46.0)
Hemoglobin: 9.9 g/dL — ABNORMAL LOW (ref 12.0–15.0)
WBC: 6.1 10*3/uL (ref 4.0–10.5)

## 2012-08-01 MED ORDER — FERROUS SULFATE 325 (65 FE) MG PO TABS: 325.0000 mg | ORAL_TABLET | Freq: Three times a day (TID) | ORAL | Status: DC

## 2012-08-01 MED ORDER — METHOCARBAMOL 500 MG PO TABS: 500.0000 mg | ORAL_TABLET | Freq: Every day | ORAL | Status: DC

## 2012-08-01 MED ORDER — POLYETHYLENE GLYCOL 3350 17 G PO PACK: 17.0000 g | PACK | Freq: Every day | ORAL | Status: DC | PRN

## 2012-08-01 MED ORDER — BISACODYL 10 MG RE SUPP: 10.0000 mg | Freq: Every day | RECTAL | Status: DC | PRN

## 2012-08-01 MED ORDER — OXYCODONE-ACETAMINOPHEN 5-325 MG PO TABS: 2.0000 | ORAL_TABLET | ORAL | Status: DC | PRN

## 2012-08-01 MED ORDER — RIVAROXABAN 10 MG PO TABS: 10.0000 mg | ORAL_TABLET | Freq: Every day | ORAL | Status: DC

## 2012-08-01 NOTE — Discharge Summary (Addendum)
Physician Discharge Summary   Patient ID: Jamie Gilmore MRN: [id number] DOB/AGE: [birth year] 66 y.o.  Admit date: 07/29/2012 Discharge date: 08/01/2012  Primary Diagnosis:  Osteoarthritis, left hip  Admission Diagnoses:  Past Medical History  Diagnosis Date  . Hypertension   . Depression   . Shortness of breath     "all my life"- rarely use  . Elevated blood sugar     "borderline diabetes"  . GERD (gastroesophageal reflux disease)   . Headache     history migraines  . Arthritis   . Peripheral vascular disease 05/2010    DVT post op knee replacement   Discharge Diagnoses:   Principal Problem:  *S/P left THA Active Problems:  Acute blood loss anemia  Estimated Body mass index is 36.94 kg/(m^2) as calculated from the following:   Height as of this encounter: 5\' 5" (1.651 m).   Weight as of this encounter: 222 lb(100.699 kg).  Classification of overweight in adults according to BMI (WHO, 1998)   Procedure: Procedure(s) (LRB): TOTAL HIP ARTHROPLASTY (Left)   Consults: None  HPI: Jamie Gilmore, 66 y.o. female, has a history of pain and functional disability in the left hip(s) due to avascular necrosis and patient has failed non-surgical conservative treatments for greater than 12 weeks to include NSAID's and/or analgesics, corticosteriod injections, use of assistive devices and activity modification. Onset of symptoms was gradual starting 1 year ago with rapidlly worsening course since that time.The patient noted no past surgery on the left hip(s). Patient currently rates pain in the left hip at 9 out of 10 with activity. Patient has night pain, worsening of pain with activity and weight bearing, pain that interfers with activities of daily living and pain with passive range of motion. Patient has evidence of subchondral cysts, joint space narrowing and avascular necrosis by imaging studies. This condition presents safety issues increasing the risk of falls. There is no  current active infection.     Laboratory Data: Admission on 07/29/2012  Component Date Value Range Status  . ABO/RH(D) 07/29/2012 O POS   Final  . Antibody Screen 07/29/2012 NEG   Final  . Sample Expiration 07/29/2012 08/01/2012   Final  . WBC 07/30/2012 7.6  4.0 - 10.5 K/uL Final  . RBC 07/30/2012 3.61* 3.87 - 5.11 MIL/uL Final  . Hemoglobin 07/30/2012 11.0* 12.0 - 15.0 g/dL Final  . HCT 40/98/1191 33.2* 36.0 - 46.0 % Final  . MCV 07/30/2012 92.0  78.0 - 100.0 fL Final  . MCH 07/30/2012 30.5  26.0 - 34.0 pg Final  . MCHC 07/30/2012 33.1  30.0 - 36.0 g/dL Final  . RDW 47/82/9562 15.2  11.5 - 15.5 % Final  . Platelets 07/30/2012 289  150 - 400 K/uL Final  . Sodium 07/30/2012 134* 135 - 145 mEq/L Final  . Potassium 07/30/2012 3.2* 3.5 - 5.1 mEq/L Final  . Chloride 07/30/2012 97  96 - 112 mEq/L Final  . CO2 07/30/2012 31  19 - 32 mEq/L Final  . Glucose, Bld 07/30/2012 104* 70 - 99 mg/dL Final  . BUN 13/03/6577 9  6 - 23 mg/dL Final  . Creatinine, Ser 07/30/2012 0.70  0.50 - 1.10 mg/dL Final  . Calcium 46/96/2952 8.4  8.4 - 10.5 mg/dL Final  . GFR calc non Af Amer 07/30/2012 90* >90 mL/min Final  . GFR calc Af Amer 07/30/2012 >90  >90 mL/min Final   Comment:  The eGFR has been calculated                          using the CKD EPI equation.                          This calculation has not been                          validated in all clinical                          situations.                          eGFR's persistently                          <90 mL/min signify                          possible Chronic Kidney Disease.  . WBC 07/31/2012 8.3  4.0 - 10.5 K/uL Final  . RBC 07/31/2012 3.45* 3.87 - 5.11 MIL/uL Final  . Hemoglobin 07/31/2012 10.4* 12.0 - 15.0 g/dL Final  . HCT 16/05/9603 31.5* 36.0 - 46.0 % Final  . MCV 07/31/2012 91.3  78.0 - 100.0 fL Final  . MCH 07/31/2012 30.1  26.0 - 34.0 pg Final  . MCHC 07/31/2012 33.0  30.0 - 36.0 g/dL  Final  . RDW 54/04/8118 14.8  11.5 - 15.5 % Final  . Platelets 07/31/2012 283  150 - 400 K/uL Final  . Sodium 07/31/2012 135  135 - 145 mEq/L Final  . Potassium 07/31/2012 3.1* 3.5 - 5.1 mEq/L Final  . Chloride 07/31/2012 95* 96 - 112 mEq/L Final  . CO2 07/31/2012 34* 19 - 32 mEq/L Final  . Glucose, Bld 07/31/2012 107* 70 - 99 mg/dL Final  . BUN 14/78/2956 5* 6 - 23 mg/dL Final  . Creatinine, Ser 07/31/2012 0.60  0.50 - 1.10 mg/dL Final  . Calcium 21/30/8657 8.3* 8.4 - 10.5 mg/dL Final  . GFR calc non Af Amer 07/31/2012 >90  >90 mL/min Final  . GFR calc Af Amer 07/31/2012 >90  >90 mL/min Final   Comment:                                 The eGFR has been calculated                          using the CKD EPI equation.                          This calculation has not been                          validated in all clinical                          situations.                          eGFR's persistently                          <  90 mL/min signify                          possible Chronic Kidney Disease.  . WBC 08/01/2012 6.1  4.0 - 10.5 K/uL Final  . RBC 08/01/2012 3.27* 3.87 - 5.11 MIL/uL Final  . Hemoglobin 08/01/2012 9.9* 12.0 - 15.0 g/dL Final  . HCT 91/47/8295 30.0* 36.0 - 46.0 % Final  . MCV 08/01/2012 91.7  78.0 - 100.0 fL Final  . MCH 08/01/2012 30.3  26.0 - 34.0 pg Final  . MCHC 08/01/2012 33.0  30.0 - 36.0 g/dL Final  . RDW 62/13/0865 14.7  11.5 - 15.5 % Final  . Platelets 08/01/2012 288  150 - 400 K/uL Final  Hospital Outpatient Visit on 07/23/2012  Component Date Value Range Status  . aPTT 07/23/2012 30  24 - 37 seconds Final  . Sodium 07/23/2012 138  135 - 145 mEq/L Final  . Potassium 07/23/2012 4.0  3.5 - 5.1 mEq/L Final  . Chloride 07/23/2012 98  96 - 112 mEq/L Final  . CO2 07/23/2012 30  19 - 32 mEq/L Final  . Glucose, Bld 07/23/2012 109* 70 - 99 mg/dL Final  . BUN 78/46/9629 8  6 - 23 mg/dL Final  . Creatinine, Ser 07/23/2012 0.66  0.50 - 1.10 mg/dL Final  .  Calcium 52/84/1324 9.5  8.4 - 10.5 mg/dL Final  . Total Protein 07/23/2012 7.0  6.0 - 8.3 g/dL Final  . Albumin 40/05/2724 3.6  3.5 - 5.2 g/dL Final  . AST 36/64/4034 14  0 - 37 U/L Final  . ALT 07/23/2012 19  0 - 35 U/L Final  . Alkaline Phosphatase 07/23/2012 77  39 - 117 U/L Final  . Total Bilirubin 07/23/2012 0.4  0.3 - 1.2 mg/dL Final  . GFR calc non Af Amer 07/23/2012 >90  >90 mL/min Final  . GFR calc Af Amer 07/23/2012 >90  >90 mL/min Final   Comment:                                 The eGFR has been calculated                          using the CKD EPI equation.                          This calculation has not been                          validated in all clinical                          situations.                          eGFR's persistently                          <90 mL/min signify                          possible Chronic Kidney Disease.  Marland Kitchen Prothrombin Time 07/23/2012 12.6  11.6 - 15.2 seconds Final  . INR 07/23/2012 0.95  0.00 - 1.49 Final  . Color, Urine  07/23/2012 YELLOW  YELLOW Final  . APPearance 07/23/2012 CLOUDY* CLEAR Final  . Specific Gravity, Urine 07/23/2012 1.019  1.005 - 1.030 Final  . pH 07/23/2012 8.0  5.0 - 8.0 Final  . Glucose, UA 07/23/2012 NEGATIVE  NEGATIVE mg/dL Final  . Hgb urine dipstick 07/23/2012 NEGATIVE  NEGATIVE Final  . Bilirubin Urine 07/23/2012 NEGATIVE  NEGATIVE Final  . Ketones, ur 07/23/2012 NEGATIVE  NEGATIVE mg/dL Final  . Protein, ur 16/05/9603 NEGATIVE  NEGATIVE mg/dL Final  . Urobilinogen, UA 07/23/2012 0.2  0.0 - 1.0 mg/dL Final  . Nitrite 54/04/8118 NEGATIVE  NEGATIVE Final  . Leukocytes, UA 07/23/2012 SMALL* NEGATIVE Final  . WBC 07/23/2012 6.5  4.0 - 10.5 K/uL Final  . RBC 07/23/2012 4.61  3.87 - 5.11 MIL/uL Final  . Hemoglobin 07/23/2012 14.1  12.0 - 15.0 g/dL Final  . HCT 14/78/2956 41.4  36.0 - 46.0 % Final  . MCV 07/23/2012 89.8  78.0 - 100.0 fL Final  . MCH 07/23/2012 30.6  26.0 - 34.0 pg Final  . MCHC 07/23/2012  34.1  30.0 - 36.0 g/dL Final  . RDW 21/30/8657 14.9  11.5 - 15.5 % Final  . Platelets 07/23/2012 384  150 - 400 K/uL Final  . MRSA, PCR 07/23/2012 NEGATIVE  NEGATIVE Final  . Staphylococcus aureus 07/23/2012 POSITIVE* NEGATIVE Final   Comment:                                 The Xpert SA Assay (FDA                          approved for NASAL specimens                          in patients over 39 years of age),                          is one component of                          a comprehensive surveillance                          program.  Test performance has                          been validated by [employer] for patients greater                          than or equal to 67 year old.                          It is not intended                          to diagnose infection nor to                          guide or monitor treatment.  . Squamous Epithelial / LPF  07/23/2012 MANY* RARE Final  . WBC, UA 07/23/2012 0-2  <3 WBC/hpf Final  . Bacteria, UA 07/23/2012 FEW* RARE Final     X-Rays:Dg Chest 2 View  07/23/2012  *RADIOLOGY REPORT*  Clinical Data: Preoperative respiratory films.  CHEST - 2 VIEW  Comparison: PA and lateral chest 05/06/2010.  Findings: Lungs are clear.  Heart size is normal.  No pneumothorax or pleural fluid.  IMPRESSION: No acute disease.   Original Report Authenticated By: Holley Dexter, M.D.    Dg Hip Portable 1 View Left  07/29/2012  *RADIOLOGY REPORT*  Clinical Data: Postoperative for left total hip replacement.  PORTABLE LEFT HIP - 1 VIEW  Comparison: None the  Findings: Portable frontal projection of the left hip demonstrates left total hip prosthesis without periprosthetic fracture.  Lateral inclination of the acetabular shell appears subjectively within normal limits. Surgical drain noted.  IMPRESSION:  1.  No radiographic features of early complication regarding the left total hip prosthesis.   Original Report Authenticated By:  Gaylyn Rong, M.D.     Hospital Course: Patient was admitted to [facility] and taken to the OR and underwent the above state procedure without complications.  Patient tolerated the procedure well and was later transferred to the recovery room and then to the orthopaedic floor for postoperative care.  They were given PO and IV analgesics for pain control following their surgery.  They were given 24 hours of postoperative antibiotics of  Anti-infectives     Start     Dose/Rate Route Frequency Ordered Stop   07/29/12 1400   ceFAZolin (ANCEF) IVPB 1 g/50 mL premix        1 g 100 mL/hr over 30 Minutes Intravenous Every 6 hours 07/29/12 1025 07/29/12 2005   07/29/12 0830   polymyxin B 500,000 Units, bacitracin 50,000 Units in sodium chloride irrigation 0.9 % 500 mL irrigation  Status:  Discontinued          As needed 07/29/12 0830 07/29/12 0915   07/29/12 0806   polymyxin B 500,000 Units, bacitracin 50,000 Units in sodium chloride irrigation 0.9 % 500 mL irrigation  Status:  Discontinued          As needed 07/29/12 0807 07/29/12 0915   07/29/12 0600   ceFAZolin (ANCEF) IVPB 2 g/50 mL premix        2 g 100 mL/hr over 30 Minutes Intravenous 60 min pre-op 07/29/12 0518 07/29/12 0731         and started on DVT prophylaxis in the form of Xarelto.   PT and OT were ordered for total hip protocol.  The patient was allowed to be WBAT with therapy. Discharge planning was consulted to help with postop disposition and equipment needs.  Patient had a good night on the evening of surgery and started to get up OOB with therapy on day one.  Hemovac drain was pulled without difficulty.  The knee immobilizer was removed and discontinued.  Continued to work with therapy into day two.  Dressing remain C/D/I.  By day three, the patient had progressed with therapy and meeting their goals.  Incision was healing well. Patient developed acute blood loss anemia but was stable and sent home with iron. Patient  was unable to go to SNF over the weekend as a result of insurance issues. She continued to work with therapy over the weekend without difficulty.  Patient was seen in rounds and was ready to go to SNF.   Discharge Medications: Prior to Admission medications  Medication Sig Start Date End Date Taking? Authorizing Provider  albuterol (PROVENTIL HFA;VENTOLIN HFA) 108 (90 BASE) MCG/ACT inhaler Inhale 2 puffs into the lungs every 6 (six) hours as needed. Wheezing and shortness of breath   Yes Historical Provider, MD  amLODipine (NORVASC) 10 MG tablet Take 5 mg by mouth at bedtime. Takes 1/2 tablet   Yes Historical Provider, MD  FLUoxetine (PROZAC) 20 MG tablet Take 20 mg by mouth daily before breakfast.   Yes Historical Provider, MD  hydrochlorothiazide (HYDRODIURIL) 25 MG tablet Take 25 mg by mouth daily before breakfast.  05/24/12  Yes Historical Provider, MD  pantoprazole (PROTONIX) 20 MG tablet Take 20 mg by mouth daily.   Yes Historical Provider, MD  atorvastatin (LIPITOR) 20 MG tablet Take 20 mg by mouth at bedtime.  05/24/12   Historical Provider, MD  bisacodyl (DULCOLAX) 10 MG suppository Place 1 suppository (10 mg total) rectally daily as needed. 08/01/12   Fleda Pagel Tamala Ser, PA  ferrous sulfate 325 (65 FE) MG tablet Take 1 tablet (325 mg total) by mouth 3 (three) times daily after meals. 08/01/12   Yanel Dombrosky Tamala Ser, PA  methocarbamol (ROBAXIN) 500 MG tablet Take 1 tablet (500 mg total) by mouth at bedtime. 08/01/12   Monty Spicher Tamala Ser, PA  oxyCODONE-acetaminophen (PERCOCET/ROXICET) 5-325 MG per tablet Take 2 tablets by mouth every 4 (four) hours as needed (Q4-6 hours PRN). 08/01/12   Marlane Hirschmann Tamala Ser, PA  polyethylene glycol (MIRALAX / GLYCOLAX) packet Take 17 g by mouth daily as needed. 08/01/12   Terriyah Westra Tamala Ser, PA  rivaroxaban (XARELTO) 10 MG TABS tablet Take 1 tablet (10 mg total) by mouth daily with breakfast. 08/01/12   Seraphim Affinito Tamala Ser, PA  zolpidem  (AMBIEN) 10 MG tablet Take 10 mg by mouth at bedtime as needed. Sleep    Historical Provider, MD    Diet: low sodium heart healthy Activity:PWB 50% No bending hip over 90 degrees- A "L" Angle Do not cross legs Do not let foot roll inward When turning these patients a pillow should be placed between the patient's legs to prevent crossing. Patients should have the affected knee fully extended when trying to sit or stand from all surfaces to prevent excessive hip flexion. When ambulating and turning toward the affected side the affected leg should have the toes turned out prior to moving the walker and the rest of patient's body as to prevent internal rotation/ turning in of the leg. Abduction pillows are the most effective way to prevent a patient from not crossing legs or turning toes in at rest. If an abduction pillow is not ordered placing a regular pillow length wise between the patient's legs is also an effective reminder. It is imperative that these precautions be maintained so that the surgical hip does not dislocate. Follow-up:in 2 weeks Disposition - Skilled nursing facility Discharged Condition: fair   Discharge Orders    Future Orders Please Complete By Expires   Diet - low sodium heart healthy      Call MD / Call 911      Comments:   If you experience chest pain or shortness of breath, CALL 911 and be transported to the hospital emergency room.  If you develope a fever above 101 F, pus (white drainage) or increased drainage or redness at the wound, or calf pain, call your surgeon's office.   Constipation Prevention      Comments:   Drink plenty of fluids.  Prune juice may be helpful.  You  may use a stool softener, such as Colace (over the counter) 100 mg twice a day.  Use MiraLax (over the counter) for constipation as needed.   Increase activity slowly as tolerated      Discharge instructions      Comments:   Walk with your walker. PWB 50% for 2 weeks Change your dressing  daily. Shower only, no tub bath. Call if any temperatures greater than 101 or any wound complications: [phone] during the day and ask for Dr. Jeannetta Ellis nurse, [facility]. Follow up in office in 2 weeks   Driving restrictions      Comments:   No driving   Follow the hip precautions as taught in Physical Therapy      Change dressing      Comments:   You may change your dressing daily with sterile 4 x 4 inch gauze dressing and paper tape.       Medication List     As of 08/01/2012  7:35 AM    STOP taking these medications         HYDROcodone-acetaminophen 5-325 MG per tablet   Commonly known as: NORCO/VICODIN      KLOR-CON M10 10 MEQ tablet   Generic drug: potassium chloride      TAKE these medications         albuterol 108 (90 BASE) MCG/ACT inhaler   Commonly known as: PROVENTIL HFA;VENTOLIN HFA   Inhale 2 puffs into the lungs every 6 (six) hours as needed. Wheezing and shortness of breath      amLODipine 10 MG tablet   Commonly known as: NORVASC   Take 5 mg by mouth at bedtime. Takes 1/2 tablet      atorvastatin 20 MG tablet   Commonly known as: LIPITOR   Take 20 mg by mouth at bedtime.      bisacodyl 10 MG suppository   Commonly known as: DULCOLAX   Place 1 suppository (10 mg total) rectally daily as needed.      ferrous sulfate 325 (65 FE) MG tablet   Take 1 tablet (325 mg total) by mouth 3 (three) times daily after meals.      FLUoxetine 20 MG tablet   Commonly known as: PROZAC   Take 20 mg by mouth daily before breakfast.      hydrochlorothiazide 25 MG tablet   Commonly known as: HYDRODIURIL   Take 25 mg by mouth daily before breakfast.      methocarbamol 500 MG tablet   Commonly known as: ROBAXIN   Take 1 tablet (500 mg total) by mouth at bedtime.      oxyCODONE-acetaminophen 5-325 MG per tablet   Commonly known as: PERCOCET/ROXICET   Take 2 tablets by mouth every 4 (four) hours as needed (Q4-6 hours PRN).      pantoprazole 20 MG tablet    Commonly known as: PROTONIX   Take 20 mg by mouth daily.      polyethylene glycol packet   Commonly known as: MIRALAX / GLYCOLAX   Take 17 g by mouth daily as needed.      rivaroxaban 10 MG Tabs tablet   Commonly known as: XARELTO   Take 1 tablet (10 mg total) by mouth daily with breakfast.      zolpidem 10 MG tablet   Commonly known as: AMBIEN   Take 10 mg by mouth at bedtime as needed. Sleep       Patient was unable to go to SNF over the weekend  as a result of difficulty with insurance. She continued to progress with therapy over the last several days and had no issues with shortness of breath or chest pain. She is prepared for discharge today.    SignedCeledonio Savage, Karimah Winquist LAUREN 08/01/2012, 7:35 AM

## 2012-08-01 NOTE — Progress Notes (Signed)
Subjective: 3 Days Post-Op Procedure(s) (LRB): TOTAL HIP ARTHROPLASTY (Left) Patient reports pain as 1 on 0-10 scale.  Acute Blood loss Anemia. Up with walker and no major complaints this A.M. Will DC to SNF  Objective: Vital signs in last 24 hours: Temp:  [97.8 F (36.6 C)-98.4 F (36.9 C)] 98 F (36.7 C) (12/26 0344) Pulse Rate:  [85-88] 86  (12/26 0344) Resp:  [16] 16  (12/26 0344) BP: (109-132)/(76-81) 132/81 mmHg (12/26 0344) SpO2:  [94 %-97 %] 97 % (12/26 0344)  Intake/Output from previous day: 12/25 0701 - 12/26 0700 In: 960 [P.O.:960] Out: 1325 [Urine:1325] Intake/Output this shift:     Basename 08/01/12 0419 07/31/12 0418 07/30/12 0437  HGB 9.9* 10.4* 11.0*    Basename 08/01/12 0419 07/31/12 0418  WBC 6.1 8.3  RBC 3.27* 3.45*  HCT 30.0* 31.5*  PLT 288 283    Basename 07/31/12 0418 07/30/12 0437  NA 135 134*  K 3.1* 3.2*  CL 95* 97  CO2 34* 31  BUN 5* 9  CREATININE 0.60 0.70  GLUCOSE 107* 104*  CALCIUM 8.3* 8.4   No results found for this basename: LABPT:2,INR:2 in the last 72 hours  Dorsiflexion/Plantar flexion intact  Assessment/Plan: 3 Days Post-Op Procedure(s) (LRB): TOTAL HIP ARTHROPLASTY (Left) Discharge to SNF  Jamie Gilmore A 08/01/2012, 7:07 AM

## 2012-08-01 NOTE — Progress Notes (Signed)
CSW still awaiting [employer] authorization (BCBS had been closed 12/24 & 12/25) anticipating authorization will be obtained tomorrow, Friday 12/27. Discharge packet is in [place] with prescriptions, signed FL2 and  Chart copy.   Unice Bailey, LCSW [facility] Clinical Social Worker cell #: [phone]

## 2012-08-01 NOTE — Progress Notes (Signed)
Physical Therapy Treatment Patient Details Name: Jamie Gilmore MRN: [id number] DOB: [birth year] Today's Date: 08/01/2012 Time: 0936-1010 PT Time Calculation (min): 34 min  PT Assessment / Plan / Recommendation Comments on Treatment Session       Follow Up Recommendations  SNF     Does the patient have the potential to tolerate intense rehabilitation     Barriers to Discharge        Equipment Recommendations  None recommended by PT    Recommendations for Other Services OT consult  Frequency 7X/week   Plan Discharge plan remains appropriate    Precautions / Restrictions Precautions Precautions: Posterior Hip Precaution Comments: pt recalls 2/3 THP Restrictions Weight Bearing Restrictions: Yes LLE Weight Bearing: Partial weight bearing LLE Partial Weight Bearing Percentage or Pounds: 50   Pertinent Vitals/Pain 5/10; premedicated, MR requested, ice pack provided    Mobility  Bed Mobility Bed Mobility: Supine to Sit Supine to Sit: 4: Min assist Details for Bed Mobility Assistance: cues for sequence and min assist with cues to avoid THP Transfers Transfers: Sit to Stand;Stand to Sit Sit to Stand: 4: Min assist Stand to Sit: 4: Min assist Details for Transfer Assistance: cues for use of UEs and for LE management Ambulation/Gait Ambulation/Gait Assistance: 4: Min assist;4: Min Government social research officer (Feet): 88 Feet Assistive device: Rolling walker Ambulation/Gait Assistance Details: cues for sequence, posture, position from RW and ER on L Gait Pattern: Step-to pattern    Exercises Total Joint Exercises Ankle Circles/Pumps: AROM;10 reps;Supine;Both Quad Sets: AROM;10 reps;Both;Supine Gluteal Sets: AROM;Both;10 reps;Supine Heel Slides: AAROM;Supine;Left;15 reps Hip ABduction/ADduction: AAROM;10 reps;Left;Supine   PT Diagnosis:    PT Problem List:   PT Treatment Interventions:     PT Goals Acute Rehab PT Goals PT Goal Formulation: With patient Time For  Goal Achievement: 08/03/12 Potential to Achieve Goals: Good Pt will go Supine/Side to Sit: with min assist PT Goal: Supine/Side to Sit - Progress: Progressing toward goal Pt will go Sit to Supine/Side: with min assist PT Goal: Sit to Supine/Side - Progress: Progressing toward goal Pt will go Sit to Stand: with min assist PT Goal: Sit to Stand - Progress: Progressing toward goal Pt will go Stand to Sit: with min assist PT Goal: Stand to Sit - Progress: Progressing toward goal Pt will Ambulate: 51 - 150 feet;with min assist;with rolling walker PT Goal: Ambulate - Progress: Progressing toward goal  Visit Information  Last PT Received On: 08/01/12 Assistance Needed: +1    Subjective Data  Subjective: I'm doing ok  Patient Stated Goal: Resume previous lifestyle with decreased pain   Cognition  Overall Cognitive Status: Appears within functional limits for tasks assessed/performed Arousal/Alertness: Awake/alert Orientation Level: Appears intact for tasks assessed Behavior During Session: [facility] for tasks performed    Balance     End of Session PT - End of Session Equipment Utilized During Treatment: Gait belt Activity Tolerance: Patient tolerated treatment well Patient left: in chair;with call bell/phone within reach;with family/visitor present Nurse Communication: Mobility status   GP     [name] 08/01/2012, 10:34 AM

## 2012-08-01 NOTE — Progress Notes (Signed)
Occupational Therapy Treatment Patient Details Name: Jamie Gilmore MRN: [id number] DOB: 16-Dec-1945 Today's Date: 08/01/2012 Time: 4098-1191 OT Time Calculation (min): 39 min  OT Assessment / Plan / Recommendation Comments on Treatment Session Pt with pain in L hip from 6 at rest to 8 with movement. Nursing made aware. Also reporting new ankle pain on medial side to touch. Nursing made aware of this also. Reviewed THPs and educated further on AE use and practiced toilet transfers.     Follow Up Recommendations  SNF    Barriers to Discharge       Equipment Recommendations  3 in 1 bedside comode    Recommendations for Other Services    Frequency Min 2X/week   Plan Discharge plan remains appropriate    Precautions / Restrictions Precautions Precautions: Posterior Hip Precaution Comments: reviewed all precautions with pt Restrictions Weight Bearing Restrictions: Yes LLE Weight Bearing: Partial weight bearing LLE Partial Weight Bearing Percentage or Pounds: 50        ADL  Lower Body Dressing: Minimal assistance;Other (comment) (practiced with sock aid at EOB. see notes) Where Assessed - Lower Body Dressing: Unsupported sitting Toilet Transfer: Minimal assistance Toilet Transfer Equipment: Raised toilet seat with arms (or 3-in-1 over toilet) Toileting - Clothing Manipulation and Hygiene: Minimal assistance Where Assessed - Toileting Clothing Manipulation and Hygiene: Standing Equipment Used: Rolling walker;Sock aid;Long-handled sponge;Long-handled shoe horn ADL Comments: Discussed AE; pt states she will likely get AE but also have husband assist PRN. Pt needed intermittant cues for THPs especially wtih turns to not roll L foot inward as well as not to reach down below knee with trying to place shoe on floor. Discussed using reacher to place shoe on floor in correct position to don. Reviewed all THPs with pt. Practiced with sock aid and shoe horn and pt needed cues to use shoe  horn correctly. Wasnt able to slide foot in shoe with gripper sock on but educated on correct positioning of shoe horn to adhere to THPs. Simulated use of LHS.     OT Diagnosis:    OT Problem List:   OT Treatment Interventions:     OT Goals ADL Goals ADL Goal: Lower Body Dressing - Progress: Progressing toward goals ADL Goal: Toilet Transfer - Progress: Progressing toward goals ADL Goal: Toileting - Hygiene - Progress: Progressing toward goals  Visit Information  Last OT Received On: 08/01/12 Assistance Needed: +1    Subjective Data  Subjective: I am supposed to be going to rehab later Patient Stated Goal: rehab to go home   Prior Functioning       Cognition  Overall Cognitive Status: Appears within functional limits for tasks assessed/performed Arousal/Alertness: Awake/alert Orientation Level: Appears intact for tasks assessed Behavior During Session: [facility] for tasks performed    Mobility  Shoulder Instructions Bed Mobility Bed Mobility: Supine to Sit Supine to Sit: 4: Min assist;HOB elevated Sit to Supine: 4: Min assist;HOB elevated Details for Bed Mobility Assistance: verbal cues and min assist for L LE Transfers Transfers: Sit to Stand;Stand to Sit Sit to Stand: 4: Min assist;With upper extremity assist;From bed;From chair/3-in-1 Stand to Sit: 4: Min assist;With upper extremity assist;To chair/3-in-1;To bed Details for Transfer Assistance: cues for THPs          Balance     End of Session OT - End of Session Activity Tolerance: Patient limited by pain Patient left: in bed;with call bell/phone within reach  GO     Lennox Laity [phone] 08/01/2012, 12:51 PM

## 2012-08-01 NOTE — Progress Notes (Addendum)
Patient is set to discharge to [facility] SN[facility]. CSW awaiting [employer] authorization. Patient & husband aware.   Clinical Social Work Department CLINICAL SOCIAL WORK PLACEMENT NOTE 08/01/2012  Patient:  Jamie Gilmore, Jamie Gilmore  Account Number:  [id number] Admit date:  07/29/2012  Clinical Social Worker:  Orpah Greek  Date/time:  07/30/2012 12:56 PM  Clinical Social Work is seeking post-discharge placement for this patient at the following level of care:   SKILLED NURSING   (*CSW will update this form in Epic as items are completed)   07/30/2012  Patient/family provided with [facility] Department of Clinical Social Work's list of facilities offering this level of care within the geographic area requested by the patient (or if unable, by the patient's family).  07/30/2012  Patient/family informed of their freedom to choose among providers that offer the needed level of care, that participate in Medicare, Medicaid or managed care program needed by the patient, have an available bed and are willing to accept the patient.  07/30/2012  Patient/family informed of [facility]' ownership interest in [facility], as well as of the fact that they are under no obligation to receive care at this facility.  PASARR submitted to EDS on 07/30/2012 PASARR number received from [employer]DS on 07/30/2012  FL2 transmitted to all facilities in geographic area requested by pt/family on  07/30/2012 FL2 transmitted to all facilities within larger geographic area on   Patient informed that his/her managed care company has contracts with or will negotiate with  certain facilities, including the following:     Patient/family informed of bed offers received:  08/01/2012 Patient chooses bed at [facility] [place] Physician recommends and patient chooses bed at    Patient to be transferred to [facility] on  08/01/2012 Patient to be transferred to  facility by ambulance Sharin Mons)  The following physician request were entered in Epic:   Additional Comments:  Unice Bailey, LCSW [facility] Clinical Social Worker cell #: [phone]

## 2012-08-02 NOTE — Progress Notes (Signed)
   Subjective: 4 Days Post-Op Procedure(s) (LRB): TOTAL HIP ARTHROPLASTY (Left) Patient reports pain as mild.   Patient seen in rounds without Dr. Darrelyn Hillock. Patient is well, and has had no acute complaints or problems. She reports that she slept very well and she felt that therapy went better yesterday. She denies shortness of breath and chest pain. No issues overnight.  Plan is to go Skilled nursing facility after hospital stay.  Objective: Vital signs in last 24 hours: Temp:  [97.5 F (36.4 C)-98.3 F (36.8 C)] 97.5 F (36.4 C) (12/27 4098) Pulse Rate:  [73-84] 81  (12/27 0642) Resp:  [15-18] 16  (12/27 0642) BP: (114-128)/(72-77) 123/77 mmHg (12/27 0642) SpO2:  [93 %-96 %] 96 % (12/27 0642)  Intake/Output from previous day:  Intake/Output Summary (Last 24 hours) at 08/02/12 0725 Last data filed at 08/02/12 1191  Gross per 24 hour  Intake   1320 ml  Output      0 ml  Net   1320 ml     Labs:  Basename 08/01/12 0419 07/31/12 0418  HGB 9.9* 10.4*    Basename 08/01/12 0419 07/31/12 0418  WBC 6.1 8.3  RBC 3.27* 3.45*  HCT 30.0* 31.5*  PLT 288 283    Basename 07/31/12 0418  NA 135  K 3.1*  CL 95*  CO2 34*  BUN 5*  CREATININE 0.60  GLUCOSE 107*  CALCIUM 8.3*    EXAM General - Patient is Alert and Oriented Extremity - Neurologically intact Dorsiflexion/Plantar flexion intact Incision: dressing C/D/I Motor Function - intact, moving foot and toes well on exam.   Past Medical History  Diagnosis Date  . Hypertension   . Depression   . Shortness of breath     "all my life"- rarely use  . Elevated blood sugar     "borderline diabetes"  . GERD (gastroesophageal reflux disease)   . Headache     history migraines  . Arthritis   . Peripheral vascular disease 05/2010    DVT post op knee replacement    Assessment/Plan: 4 Days Post-Op Procedure(s) (LRB): TOTAL HIP ARTHROPLASTY (Left) Principal Problem:  *S/P left THA Active Problems:  Acute blood loss  anemia  Estimated Body mass index is 36.94 kg/(m^2) as calculated from the following:   Height as of this encounter: 5\' 5" (1.651 m).   Weight as of this encounter: 222 lb(100.699 kg). Advance diet Up with therapy Discharge to SNF  DVT Prophylaxis - Xarelto PWB 50% left leg  Patient doing very well. She is ready to go to SNF today. She was ready yesterday but was unable to go due to holiday closings. We will see her in the office in 2 weeks from surgery. Change dressing one week from surgery unless needs reinforecment earlier.   Chasiti Waddington LAUREN 08/02/2012, 7:25 AM

## 2012-08-02 NOTE — Progress Notes (Signed)
Physical Therapy Treatment Patient Details Name: Jamie Gilmore MRN: [id number] DOB: [birth year] Today's Date: 08/02/2012 Time: 4098-1191 PT Time Calculation (min): 26 min  PT Assessment / Plan / Recommendation Comments on Treatment Session  POD # 4 L Posterior THR.  Pt progressing slowly due to pain. Pt plans to D/C to SNF for Rehab as she is unable to acend ther 20 steps to get into her Apartment.     Follow Up Recommendations  SNF     Does the patient have the potential to tolerate intense rehabilitation     Barriers to Discharge        Equipment Recommendations  None recommended by PT    Recommendations for Other Services    Frequency 7X/week   Plan Discharge plan remains appropriate    Precautions / Restrictions Precautions Precautions: Posterior Hip Precaution Comments: Pt recalls 3/3 THP and aware she is PWB Restrictions Weight Bearing Restrictions: Yes LLE Weight Bearing: Partial weight bearing LLE Partial Weight Bearing Percentage or Pounds: 50%   Pertinent Vitals/Pain C/o 8/10 L hip pain despite pre medication ICE applied    Mobility  Bed Mobility Bed Mobility: Not assessed Details for Bed Mobility Assistance: Pt OOB in recliner Transfers Transfers: Sit to Stand;Stand to Sit Sit to Stand: 4: Min assist;From toilet;From chair/3-in-1 Stand to Sit: To chair/3-in-1;To toilet;4: Min assist Details for Transfer Assistance: increased time 2nd pain. One VC to avoid hip flex > 90' during stand to sit in the bathroom as pt flexed too far forward Ambulation/Gait Ambulation/Gait Assistance: 4: Min guard Ambulation Distance (Feet): 64 Feet Ambulation/Gait Assistance Details: decreased amb distance 2nd increased c/o pain.  Slow gait.  50% VC's to increase L knee flexion during swing pahse to decrease stress/load of L hip.  Gait Pattern: Step-to pattern;Decreased stance time - left Gait velocity: decreased    Exercises Total Joint Exercises Ankle Circles/Pumps:  AROM;Both;10 reps;Seated Quad Sets: AROM;Both;10 reps;Seated Gluteal Sets: AROM;Both;10 reps;Seated    PT Goals                                                          Progressing slowly    Visit Information  Last PT Received On: 08/02/12 Assistance Needed: +1    Subjective Data  Subjective: My reflux is bothering me more this morning Patient Stated Goal: n/a   Cognition    good   Balance   fair  End of Session PT - End of Session Equipment Utilized During Treatment: Gait belt Activity Tolerance: Patient limited by pain Patient left: in chair;with call bell/phone within reach;with family/visitor present (ICE to L hip)   Felecia Shelling  PTA [facility]  Acute  Rehab Pager     [phone]

## 2012-08-03 NOTE — Progress Notes (Signed)
Subjective: 5 Days Post-Op Procedure(s) (LRB): TOTAL HIP ARTHROPLASTY (Left) Patient reports pain as 2 on 0-10 scale. Still waiting for Insurance approval to DC to SNF. No problems today.  Objective: Vital signs in last 24 hours: Temp:  [97.9 F (36.6 C)-99.4 F (37.4 C)] 97.9 F (36.6 C) (12/28 0615) Pulse Rate:  [76-77] 76  (12/28 0615) Resp:  [16] 16  (12/28 0615) BP: (101-134)/(67-84) 101/67 mmHg (12/28 0615) SpO2:  [95 %-99 %] 95 % (12/28 0615)  Intake/Output from previous day: 12/27 0701 - 12/28 0700 In: 600 [P.O.:600] Out: 300 [Urine:300] Intake/Output this shift:     Basename 08/01/12 0419  HGB 9.9*    Basename 08/01/12 0419  WBC 6.1  RBC 3.27*  HCT 30.0*  PLT 288   No results found for this basename: NA:2,K:2,CL:2,CO2:2,BUN:2,CREATININE:2,GLUCOSE:2,CALCIUM:2 in the last 72 hours No results found for this basename: LABPT:2,INR:2 in the last 72 hours  Dorsiflexion/Plantar flexion intact Compartment soft  Assessment/Plan: 5 Days Post-Op Procedure(s) (LRB): TOTAL HIP ARTHROPLASTY (Left) Up with therapy Waiting for DC to SNF.  Jamie Gilmore A 08/03/2012, 7:32 AM

## 2012-08-03 NOTE — Progress Notes (Signed)
Per Tammie at [employer] chosen facility, [facility], facility has yet to receive insurance authorization.  Unit CSW to follow up on Monday.  CSW to continue to follow.  Providence Crosby, LCSWA Clinical Social Work [phone]

## 2012-08-04 NOTE — Progress Notes (Signed)
Physical Therapy Treatment Patient Details Name: Jamie Gilmore MRN: [id number] DOB: Oct 08, 1945 Today's Date: 08/04/2012 Time: 1400-1436 PT Time Calculation (min): 36 min  PT Assessment / Plan / Recommendation Comments on Treatment Session       Follow Up Recommendations  SNF     Does the patient have the potential to tolerate intense rehabilitation     Barriers to Discharge        Equipment Recommendations  None recommended by PT    Recommendations for Other Services OT consult  Frequency 7X/week   Plan Discharge plan remains appropriate    Precautions / Restrictions Precautions Precautions: Posterior Hip Precaution Comments: Pt recalls 3/3 THP and aware she is PWB Restrictions Weight Bearing Restrictions: Yes LLE Weight Bearing: Partial weight bearing LLE Partial Weight Bearing Percentage or Pounds: 50   Pertinent Vitals/Pain     Mobility  Bed Mobility Bed Mobility: Supine to Sit;Sit to Supine Supine to Sit: 4: Min assist;HOB elevated Sit to Supine: 4: Min assist;With rail;HOB flat Details for Bed Mobility Assistance: cues for sequence and use of L LE to self assist Transfers Transfers: Sit to Stand;Stand to Sit Sit to Stand: 4: Min guard Stand to Sit: 4: Min guard Details for Transfer Assistance: cues for use of UEs and for LE management Ambulation/Gait Ambulation/Gait Assistance: 4: Min guard Ambulation Distance (Feet): 98 Feet (and 20) Assistive device: Rolling walker Ambulation/Gait Assistance Details: cues for posture, sequence and position from RW to insure PWB Gait Pattern: Step-to pattern;Decreased stance time - left Gait velocity: decreased Stairs: No    Exercises     PT Diagnosis:    PT Problem List:   PT Treatment Interventions:     PT Goals Acute Rehab PT Goals PT Goal Formulation: With patient Time For Goal Achievement: 08/03/12 Potential to Achieve Goals: Good Pt will go Supine/Side to Sit: with supervision PT Goal: Supine/Side to  Sit - Progress: Updated due to goal met Pt will go Sit to Supine/Side: with supervision PT Goal: Sit to Supine/Side - Progress: Updated due to goal met Pt will go Sit to Stand: with supervision PT Goal: Sit to Stand - Progress: Updated due to goal met Pt will go Stand to Sit: with supervision PT Goal: Stand to Sit - Progress: Updated due to goals met Pt will Ambulate: 51 - 150 feet;with rolling walker;with supervision PT Goal: Ambulate - Progress: Updated due to goal met  Visit Information  Last PT Received On: 08/04/12 Assistance Needed: +1    Subjective Data  Subjective: My grandson is coming to see me today Patient Stated Goal: rehab and home   Cognition  Overall Cognitive Status: Appears within functional limits for tasks assessed/performed Arousal/Alertness: Awake/alert Orientation Level: Appears intact for tasks assessed Behavior During Session: [facility] for tasks performed    Balance     End of Session PT - End of Session Equipment Utilized During Treatment: Gait belt Activity Tolerance: Patient limited by fatigue;Patient tolerated treatment well Patient left: in bed;with call bell/phone within reach Nurse Communication: Mobility status   GP     Jamie Gilmore 08/04/2012, 3:56 PM

## 2012-08-04 NOTE — Progress Notes (Signed)
Physical Therapy Treatment Patient Details Name: Jamie Gilmore MRN: [id number] DOB: 1946-01-15 Today's Date: 08/04/2012 Time: 4098-1191 PT Time Calculation (min): 23 min  PT Assessment / Plan / Recommendation Comments on Treatment Session  Pt requests amb deferred to after pain meds    Follow Up Recommendations  SNF     Does the patient have the potential to tolerate intense rehabilitation     Barriers to Discharge        Equipment Recommendations  None recommended by PT    Recommendations for Other Services OT consult  Frequency 7X/week   Plan Discharge plan remains appropriate    Precautions / Restrictions Precautions Precautions: Posterior Hip Precaution Comments: Pt recalls 3/3 THP and aware she is PWB Restrictions Weight Bearing Restrictions: Yes LLE Weight Bearing: Partial weight bearing LLE Partial Weight Bearing Percentage or Pounds: 50   Pertinent Vitals/Pain     Mobility  Bed Mobility Bed Mobility: Not assessed Transfers Transfers: Not assessed    Exercises Total Joint Exercises Ankle Circles/Pumps: AROM;Both;Seated;20 reps Quad Sets: AROM;Both;Seated;15 reps Gluteal Sets: AROM;Both;Seated;15 reps Heel Slides: AAROM;Supine;Left;15 reps Hip ABduction/ADduction: AAROM;Left;Supine;15 reps   PT Diagnosis:    PT Problem List:   PT Treatment Interventions:     PT Goals Acute Rehab PT Goals PT Goal Formulation: With patient Time For Goal Achievement: 08/03/12 Potential to Achieve Goals: Good  Visit Information  Last PT Received On: 08/04/12 Assistance Needed: +1    Subjective Data  Subjective: They finally approved my rehab so I leave tomorrow   Cognition  Overall Cognitive Status: Appears within functional limits for tasks assessed/performed Arousal/Alertness: Awake/alert Orientation Level: Appears intact for tasks assessed Behavior During Session: [facility] for tasks performed    Balance     End of Session PT - End of Session Equipment  Utilized During Treatment: Gait belt Activity Tolerance: Patient limited by pain Patient left: in chair;with call bell/phone within reach;with family/visitor present Nurse Communication: Mobility status   GP     Amour Cutrone 08/04/2012, 1:08 PM

## 2012-08-04 NOTE — Progress Notes (Signed)
CSW contacted facility to let them know that the authorization was received by our facility and asking if the Pt were able to come to their facility.   Supervisor on duty would not allow Pt to come to the facility today and stated that [facility] would handle that on Monday.   CSW was able to obtain their fax number and provided facility with a copy of authorization for Monday d/c.   Weekday CSW to f/u with d/c planning to facility on Monday.   Leron Croak, LCSWA [employer] Cover[employer] [phone]

## 2012-08-04 NOTE — Progress Notes (Signed)
Subjective: 6 Days Post-Op Procedure(s) (LRB): TOTAL HIP ARTHROPLASTY (Left) Patient reports pain as 2 on 0-10 scale.    Objective: Vital signs in last 24 hours: Temp:  [97.7 F (36.5 C)-97.9 F (36.6 C)] 97.7 F (36.5 C) (12/29 0542) Pulse Rate:  [72-78] 72  (12/29 0542) Resp:  [16] 16  (12/29 0542) BP: (113-141)/(76-77) 141/76 mmHg (12/29 0542) SpO2:  [95 %-99 %] 99 % (12/29 0542)  Intake/Output from previous day: 12/28 0701 - 12/29 0700 In: 720 [P.O.:720] Out: -  Intake/Output this shift: Total I/O In: 240 [P.O.:240] Out: -   No results found for this basename: HGB:5 in the last 72 hours No results found for this basename: WBC:2,RBC:2,HCT:2,PLT:2 in the last 72 hours No results found for this basename: NA:2,K:2,CL:2,CO2:2,BUN:2,CREATININE:2,GLUCOSE:2,CALCIUM:2 in the last 72 hours No results found for this basename: LABPT:2,INR:2 in the last 72 hours  Neurologically intact Intact pulses distally Dorsiflexion/Plantar flexion intact Incision: dressing C/D/I  Assessment/Plan: 6 Days Post-Op Procedure(s) (LRB): TOTAL HIP ARTHROPLASTY (Left) Plan for discharge tomorrow  Jamie Gilmore C 08/04/2012, 11:56 AM

## 2012-08-05 NOTE — Progress Notes (Signed)
Clinical Social Worker received notification from [facility] that insurance authorization was received today for SNF stay for pt. Clinical Social Worker facilitated pt discharge needs including contacting the facility, faxing pt discharge information via [facility], discussing with pt at bedside, providing contact phone number to RN to call report, and arranging ambulance transportation for pt to [facility]. No further social work needs identified at this time. Clinical Social Worker signing off.   Jacklynn Lewis, MSW, LCSWA  Clinical Social Work [phone]

## 2012-08-05 NOTE — Progress Notes (Signed)
Subjective: 7 Days Post-Op Procedure(s) (LRB): TOTAL HIP ARTHROPLASTY (Left) Patient reports pain as 1 on 0-10 scale.  Still waiting on DC . We have been waiting for Insurance approval to Transfer to SNF for several days. No post-op problems today.  Objective: Vital signs in last 24 hours: Temp:  [98 F (36.7 C)-98.7 F (37.1 C)] 98 F (36.7 C) (12/30 0440) Pulse Rate:  [77-78] 78  (12/30 0440) Resp:  [16-20] 20  (12/30 0440) BP: (109-129)/(75-86) 109/75 mmHg (12/30 0440) SpO2:  [96 %-100 %] 100 % (12/30 0440)  Intake/Output from previous day: 12/29 0701 - 12/30 0700 In: 600 [P.O.:600] Out: -  Intake/Output this shift:    No results found for this basename: HGB:5 in the last 72 hours No results found for this basename: WBC:2,RBC:2,HCT:2,PLT:2 in the last 72 hours No results found for this basename: NA:2,K:2,CL:2,CO2:2,BUN:2,CREATININE:2,GLUCOSE:2,CALCIUM:2 in the last 72 hours No results found for this basename: LABPT:2,INR:2 in the last 72 hours  Dorsiflexion/Plantar flexion intact  Assessment/Plan: 7 Days Post-Op Procedure(s) (LRB): TOTAL HIP ARTHROPLASTY (Left) Up with therapy To SNF today.  Aubryn Spinola A 08/05/2012, 7:25 AM

## 2012-08-05 NOTE — Progress Notes (Signed)
Discharge summary sent to payer through [facility]  

## 2012-08-05 NOTE — Progress Notes (Signed)
Physical Therapy Treatment Patient Details Name: Jamie Gilmore MRN: [id number] DOB: 1945-11-17 Today's Date: 08/05/2012 Time: 6578-4696 PT Time Calculation (min): 44 min  PT Assessment / Plan / Recommendation Comments on Treatment Session  POD # 7 L Posterior THR awaiting Ins approval to D/C to SNF has now decided to D/C to home due to frustartion/battle with getting [employer] to approve SNF.  Practiced a flight of stairs with spouse which pt was able to perform with help.  Given handout on HEP and hip percautions.  Instructed on use of ICE.  Instructed on proper tech to get in/out car.   Pt has a RW already at home but is requesting a 3;1.  Notified Care Coordinator of change in D/C plan.     Follow Up Recommendations  Home health PT     Does the patient have the potential to tolerate intense rehabilitation     Barriers to Discharge        Equipment Recommendations  Other (comment) (3:1 wide per pt request)    Recommendations for Other Services    Frequency     Plan Other (comment) (Pt now plans to D/C to home due to Ins issues)    Precautions / Restrictions Precautions Precautions: Posterior Hip Precaution Comments: Pt recalls 3/3 THP and aware she is PWB  Restrictions Weight Bearing Restrictions: Yes LLE Weight Bearing: Partial weight bearing LLE Partial Weight Bearing Percentage or Pounds: 50%   Pertinent Vitals/Pain C/o 4/10 L hip pain ICE applied    Mobility  Bed Mobility Bed Mobility: Not assessed Details for Bed Mobility Assistance: Pt OOB in recliner Transfers Transfers: Sit to Stand;Stand to Sit Sit to Stand: 5: Supervision;From chair/3-in-1;From toilet Stand to Sit: 5: Supervision;To chair/3-in-1;To toilet Details for Transfer Assistance: increased time and one VC to avoid hip flex > 90' by keeping her "nose in the air" during sit to stand to sit. Ambulation/Gait Ambulation/Gait Assistance: 5: Supervision Ambulation Distance (Feet): 48 Feet Assistive device:  Rolling walker Ambulation/Gait Assistance Details: increased time and <25% VC's safety with turns Gait Pattern: Step-to pattern;Decreased stance time - left;Trunk flexed Gait velocity: decreased Stairs: Yes Stairs Assistance: 4: Min guard Stair Management Technique: One rail Right;With crutches;Forwards (with spouse under direction of therapist on proper sequencin) Number of Stairs: 12      PT Goals                                                      progressing    Visit Information  Last PT Received On: 08/05/12 Assistance Needed: +1 Reason Eval/Treat Not Completed: Pain limiting ability to participate    Subjective Data  Subjective: I want to go home Patient Stated Goal: home   Cognition    good   Balance   good with RW  End of Session PT - End of Session Equipment Utilized During Treatment: Gait belt Activity Tolerance: Patient tolerated treatment well Patient left: in chair;with call bell/phone within reach;with family/visitor present   Felecia Shelling  PTA [facility]  Acute  Rehab Pager     [phone]

## 2012-12-03 ENCOUNTER — Other Ambulatory Visit: Payer: Self-pay | Admitting: Family Medicine

## 2012-12-03 DIAGNOSIS — N281 Cyst of kidney, acquired: Secondary | ICD-10-CM

## 2012-12-17 ENCOUNTER — Other Ambulatory Visit: Payer: BC Managed Care – PPO

## 2012-12-20 ENCOUNTER — Ambulatory Visit
Admission: RE | Admit: 2012-12-20 | Discharge: 2012-12-20 | Disposition: A | Discharge status: Home / Self Care | Payer: BC Managed Care – PPO | Source: Ambulatory Visit | Attending: Family Medicine | Admitting: Family Medicine

## 2012-12-20 DIAGNOSIS — N281 Cyst of kidney, acquired: Secondary | ICD-10-CM

## 2013-06-02 ENCOUNTER — Other Ambulatory Visit: Payer: Self-pay | Admitting: Family Medicine

## 2013-06-02 DIAGNOSIS — N281 Cyst of kidney, acquired: Secondary | ICD-10-CM

## 2013-06-10 ENCOUNTER — Ambulatory Visit
Admission: RE | Admit: 2013-06-10 | Discharge: 2013-06-10 | Disposition: A | Discharge status: Home / Self Care | Payer: BC Managed Care – PPO | Source: Ambulatory Visit | Attending: Family Medicine | Admitting: Family Medicine

## 2013-06-10 DIAGNOSIS — N281 Cyst of kidney, acquired: Secondary | ICD-10-CM

## 2013-06-13 ENCOUNTER — Other Ambulatory Visit: Payer: BC Managed Care – PPO

## 2013-07-08 ENCOUNTER — Other Ambulatory Visit (HOSPITAL_COMMUNITY): Payer: Self-pay | Admitting: Family Medicine

## 2013-07-08 DIAGNOSIS — R0600 Dyspnea, unspecified: Secondary | ICD-10-CM

## 2013-08-13 ENCOUNTER — Other Ambulatory Visit: Payer: Self-pay | Admitting: Adult Health

## 2013-08-20 ENCOUNTER — Encounter (HOSPITAL_COMMUNITY): Payer: BC Managed Care – PPO

## 2013-10-15 ENCOUNTER — Other Ambulatory Visit: Payer: Self-pay | Admitting: Adult Health

## 2013-10-23 DIAGNOSIS — J329 Chronic sinusitis, unspecified: Secondary | ICD-10-CM | POA: Diagnosis not present

## 2013-10-23 DIAGNOSIS — M7989 Other specified soft tissue disorders: Secondary | ICD-10-CM | POA: Diagnosis not present

## 2014-02-03 ENCOUNTER — Other Ambulatory Visit: Payer: Self-pay

## 2014-02-03 DIAGNOSIS — Z1231 Encounter for screening mammogram for malignant neoplasm of breast: Secondary | ICD-10-CM

## 2014-02-10 ENCOUNTER — Encounter (INDEPENDENT_AMBULATORY_CARE_PROVIDER_SITE_OTHER): Payer: Self-pay

## 2014-02-10 ENCOUNTER — Ambulatory Visit
Admission: RE | Admit: 2014-02-10 | Discharge: 2014-02-10 | Disposition: A | Discharge status: Home / Self Care | Payer: BC Managed Care – PPO | Source: Ambulatory Visit

## 2014-02-10 DIAGNOSIS — Z1231 Encounter for screening mammogram for malignant neoplasm of breast: Secondary | ICD-10-CM

## 2014-05-04 IMAGING — US US RENAL
1 series · 14 of 25 positions shown · non-contrast
Comparison: Ultrasound 12/20/2012.

CLINICAL DATA: Renal cysts.

EXAM:
RENAL/URINARY TRACT ULTRASOUND COMPLETE

[Series 1: us renal · 0.27mm/px · 14 of 26 slices shown]
[im 1/26]
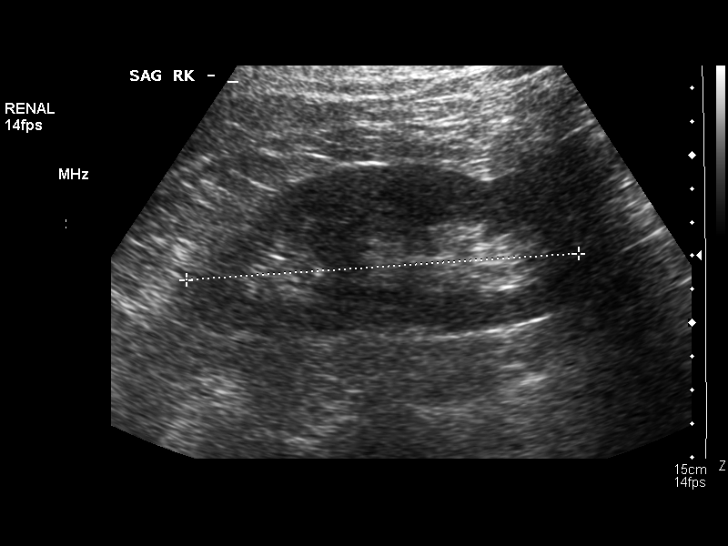
[im 3/26]
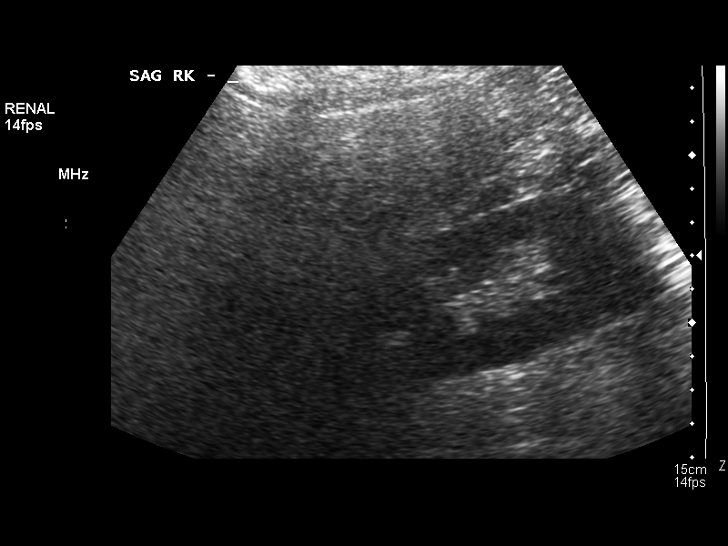
[im 5/26]
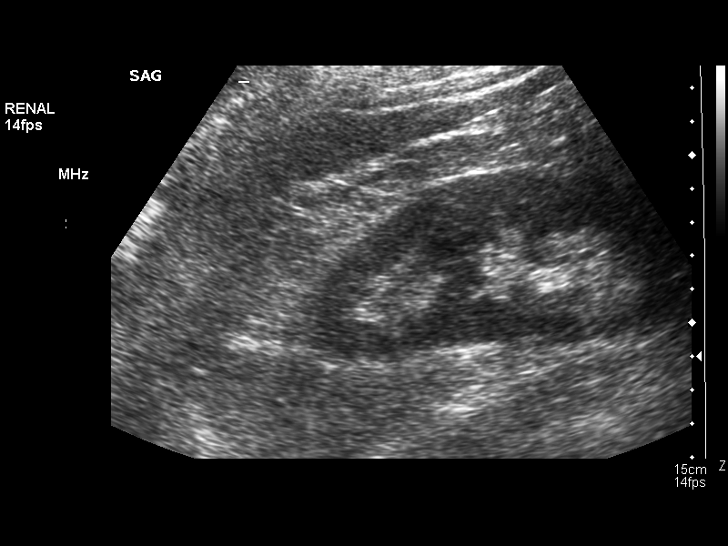
[im 7/26]
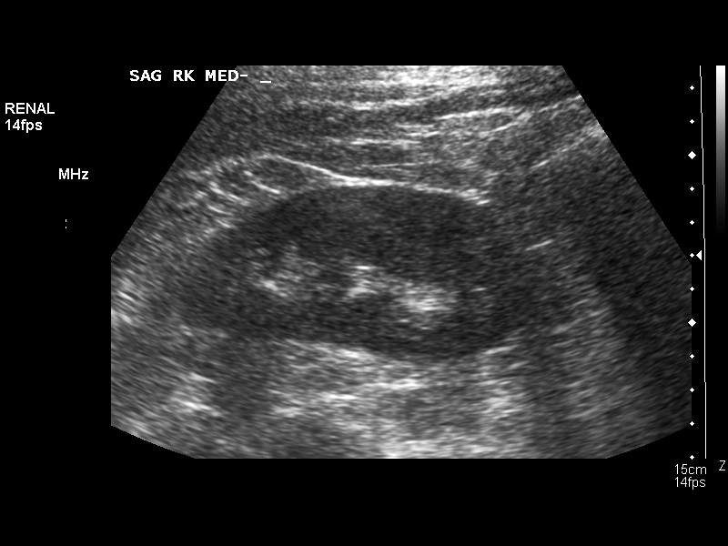
[im 9/26]
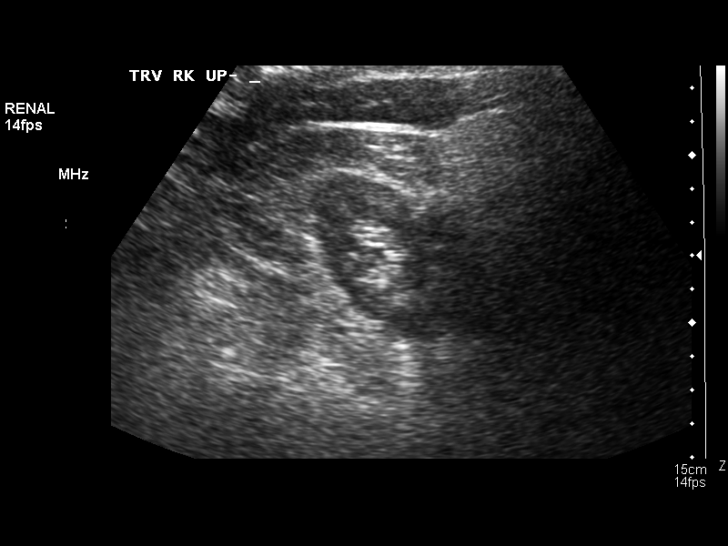
[im 10/26]
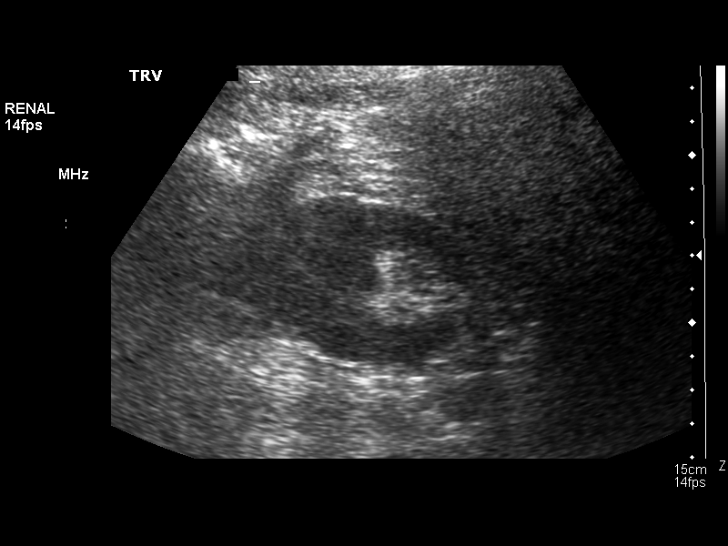
[im 12/26]
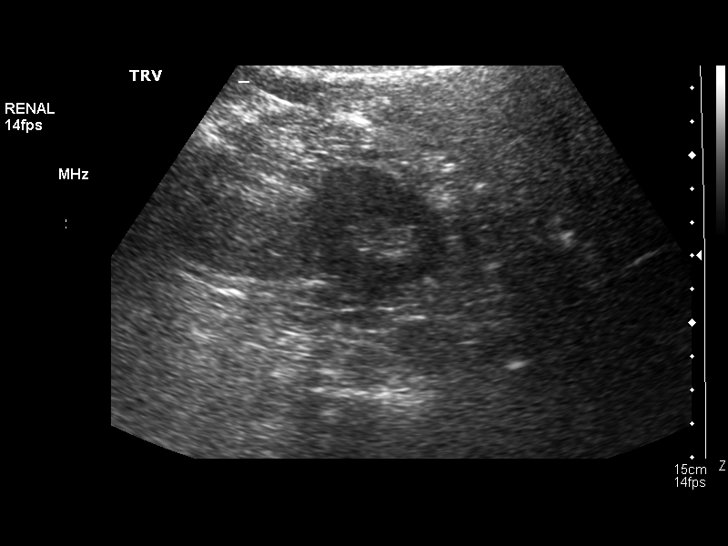
[im 14/26]
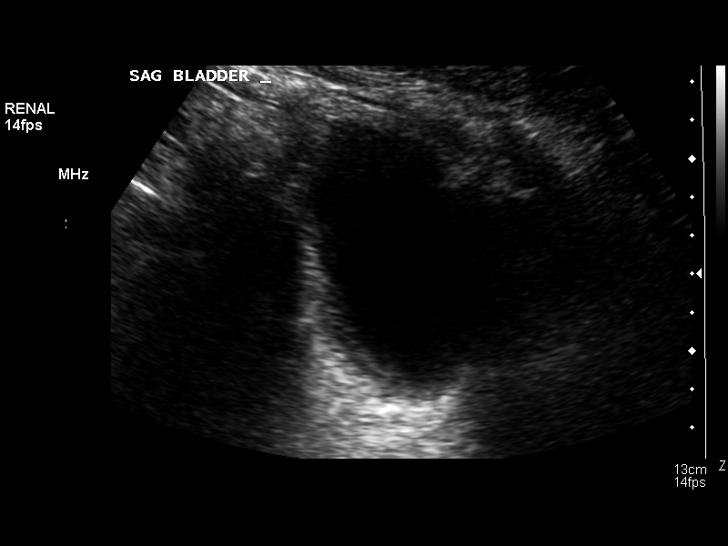
[im 16/26]
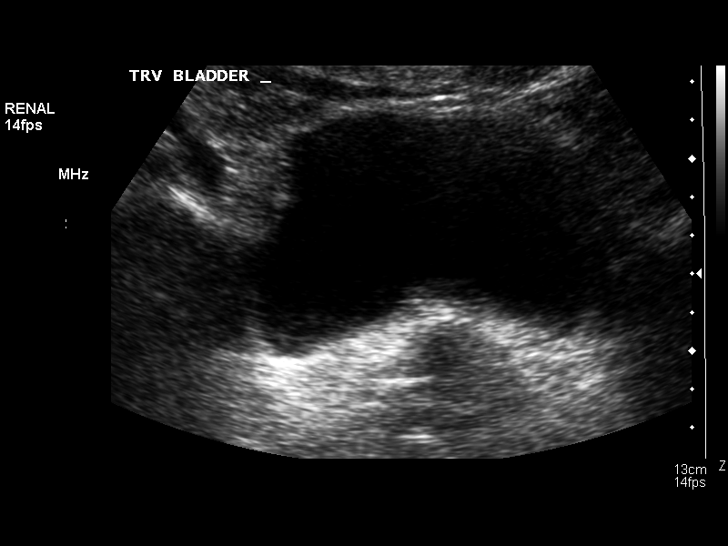
[im 17/26]
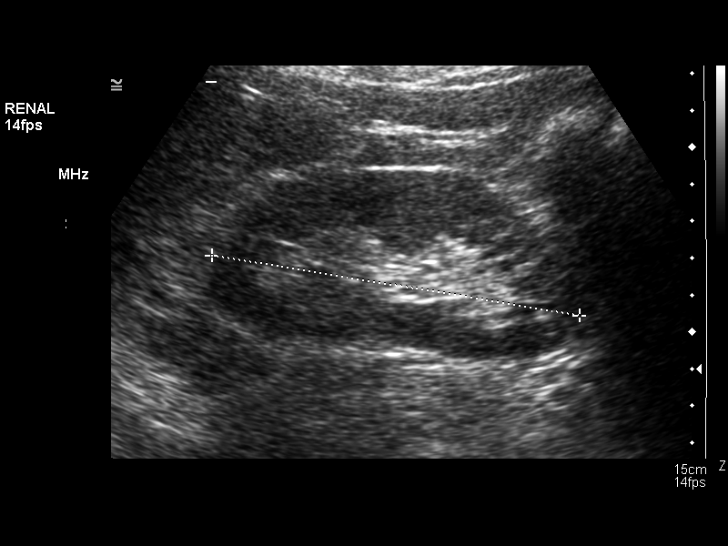
[im 19/26]
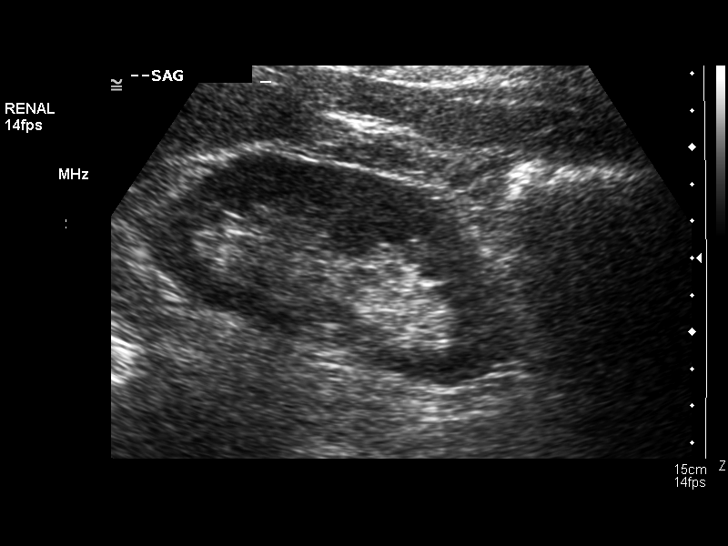
[im 21/26]
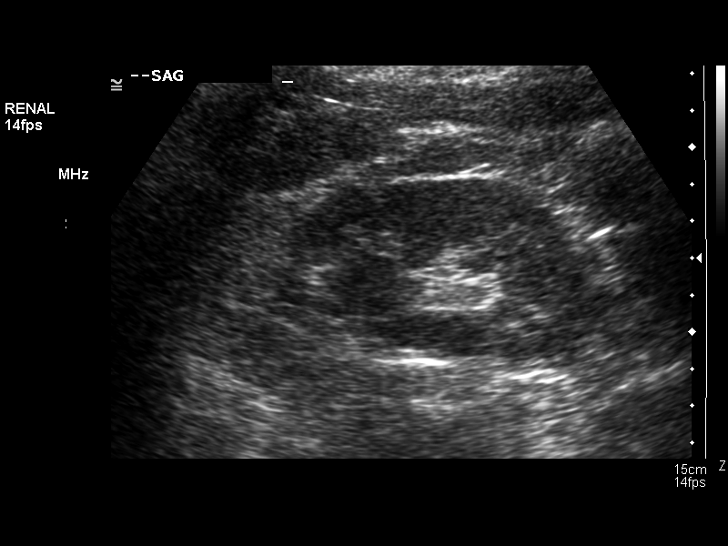
[im 23/26]
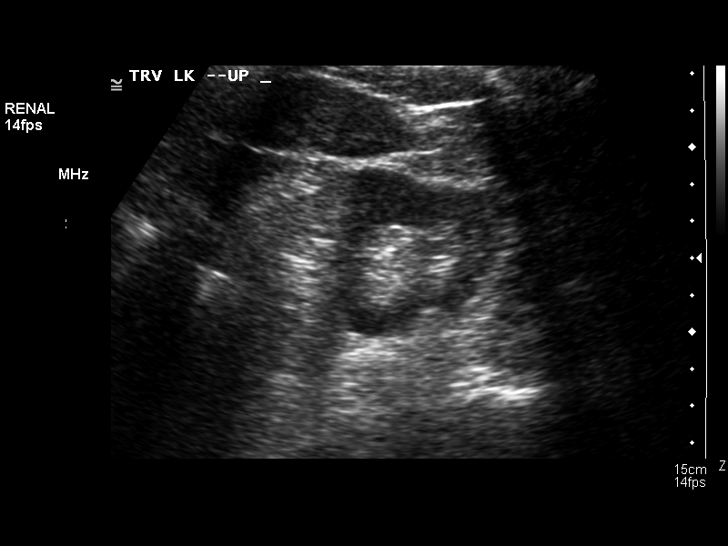
[im 26/26]
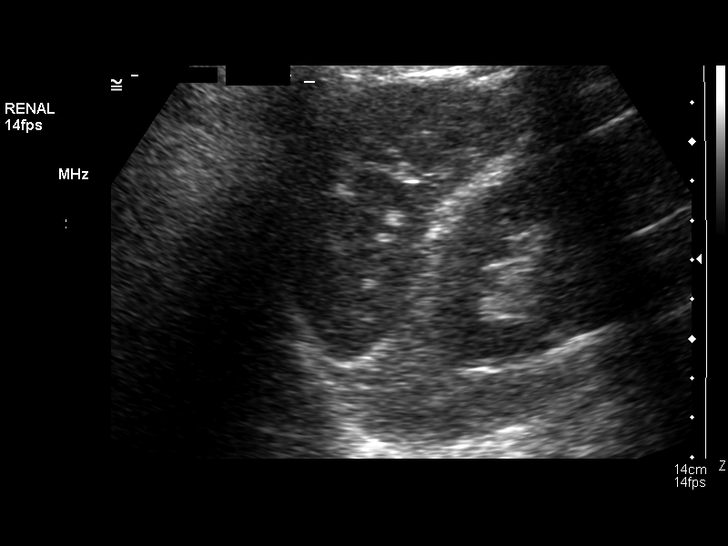

[14 of 25 positions shown; findings below may reference images not displayed]

FINDINGS: Right Kidney

Length: 11.7 cm. Echogenicity within normal limits. No mass or
hydronephrosis visualized. Previously visualized cyst no longer
identified.

Left Kidney

Length: 2.1 cm. Echogenicity within normal limits. No mass or
hydronephrosis visualized. Previously visualized cyst no longer
identified.

Bladder

Appears normal for degree of bladder distention.
IMPRESSION: Normal exam. Previously identified renal cysts have resolved.

## 2015-02-11 ENCOUNTER — Encounter (HOSPITAL_COMMUNITY): Payer: Self-pay | Admitting: Emergency Medicine

## 2015-02-11 ENCOUNTER — Emergency Department (HOSPITAL_COMMUNITY): Payer: Medicare Other

## 2015-02-11 ENCOUNTER — Emergency Department (HOSPITAL_COMMUNITY)
Admission: EM | Admit: 2015-02-11 | Discharge: 2015-02-11 | Disposition: A | Discharge status: Home / Self Care | Payer: Medicare Other | Attending: Emergency Medicine | Admitting: Emergency Medicine

## 2015-02-11 DIAGNOSIS — I10 Essential (primary) hypertension: Secondary | ICD-10-CM | POA: Insufficient documentation

## 2015-02-11 DIAGNOSIS — Z7901 Long term (current) use of anticoagulants: Secondary | ICD-10-CM | POA: Insufficient documentation

## 2015-02-11 DIAGNOSIS — R202 Paresthesia of skin: Secondary | ICD-10-CM | POA: Diagnosis not present

## 2015-02-11 DIAGNOSIS — M1990 Unspecified osteoarthritis, unspecified site: Secondary | ICD-10-CM | POA: Diagnosis not present

## 2015-02-11 DIAGNOSIS — K219 Gastro-esophageal reflux disease without esophagitis: Secondary | ICD-10-CM | POA: Insufficient documentation

## 2015-02-11 DIAGNOSIS — R0789 Other chest pain: Secondary | ICD-10-CM

## 2015-02-11 DIAGNOSIS — E876 Hypokalemia: Secondary | ICD-10-CM | POA: Diagnosis not present

## 2015-02-11 DIAGNOSIS — I739 Peripheral vascular disease, unspecified: Secondary | ICD-10-CM | POA: Insufficient documentation

## 2015-02-11 DIAGNOSIS — Z720 Tobacco use: Secondary | ICD-10-CM | POA: Insufficient documentation

## 2015-02-11 DIAGNOSIS — Z79899 Other long term (current) drug therapy: Secondary | ICD-10-CM | POA: Diagnosis not present

## 2015-02-11 DIAGNOSIS — R2990 Unspecified symptoms and signs involving the nervous system: Secondary | ICD-10-CM | POA: Diagnosis not present

## 2015-02-11 DIAGNOSIS — F329 Major depressive disorder, single episode, unspecified: Secondary | ICD-10-CM | POA: Diagnosis not present

## 2015-02-11 DIAGNOSIS — R200 Anesthesia of skin: Secondary | ICD-10-CM | POA: Insufficient documentation

## 2015-02-11 DIAGNOSIS — R0602 Shortness of breath: Secondary | ICD-10-CM | POA: Diagnosis not present

## 2015-02-11 LAB — COMPREHENSIVE METABOLIC PANEL
ALT: 15 U/L (ref 14–54)
AST: 16 U/L (ref 15–41)
Albumin: 3.1 g/dL — ABNORMAL LOW (ref 3.5–5.0)
Alkaline Phosphatase: 55 U/L (ref 38–126)
Anion gap: 13 (ref 5–15)
BILIRUBIN TOTAL: 0.1 mg/dL — AB (ref 0.3–1.2)
BUN: 8 mg/dL (ref 6–20)
CALCIUM: 7.2 mg/dL — AB (ref 8.9–10.3)
CHLORIDE: 95 mmol/L — AB (ref 101–111)
CO2: 30 mmol/L (ref 22–32)
CREATININE: 0.82 mg/dL (ref 0.44–1.00)
GLUCOSE: 83 mg/dL (ref 65–99)
Potassium: 2.5 mmol/L — CL (ref 3.5–5.1)
Sodium: 138 mmol/L (ref 135–145)
Total Protein: 6.5 g/dL (ref 6.5–8.1)

## 2015-02-11 LAB — CBC WITH DIFFERENTIAL/PLATELET
BASOS ABS: 0 10*3/uL (ref 0.0–0.1)
BASOS PCT: 0 % (ref 0–1)
EOS ABS: 0.2 10*3/uL (ref 0.0–0.7)
Eosinophils Relative: 2 % (ref 0–5)
HCT: 38.7 % (ref 36.0–46.0)
HEMOGLOBIN: 13.1 g/dL (ref 12.0–15.0)
Lymphocytes Relative: 20 % (ref 12–46)
Lymphs Abs: 1.8 10*3/uL (ref 0.7–4.0)
MCH: 30.4 pg (ref 26.0–34.0)
MCHC: 33.9 g/dL (ref 30.0–36.0)
MCV: 89.8 fL (ref 78.0–100.0)
MONOS PCT: 8 % (ref 3–12)
Monocytes Absolute: 0.7 10*3/uL (ref 0.1–1.0)
NEUTROS PCT: 70 % (ref 43–77)
Neutro Abs: 6.5 10*3/uL (ref 1.7–7.7)
Platelets: 365 10*3/uL (ref 150–400)
RBC: 4.31 MIL/uL (ref 3.87–5.11)
RDW: 14.3 % (ref 11.5–15.5)
WBC: 9.3 10*3/uL (ref 4.0–10.5)

## 2015-02-11 LAB — TROPONIN I: Troponin I: <0.03 ng/mL (ref <0.031)

## 2015-02-11 LAB — MAGNESIUM: MAGNESIUM: 1 mg/dL — AB (ref 1.7–2.4)

## 2015-02-11 MED ORDER — MAGNESIUM SULFATE 2 GM/50ML IV SOLN
2.0000 g | INTRAVENOUS | Status: AC
  Administered 2015-02-11: 2 g via INTRAVENOUS

## 2015-02-11 MED ORDER — POTASSIUM CHLORIDE ER 20 MEQ PO TBCR
40.0000 meq | EXTENDED_RELEASE_TABLET | Freq: Every day | ORAL | Status: DC
Start: 2015-02-11 — End: 2020-07-16

## 2015-02-11 MED ORDER — MAGNESIUM SULFATE 2 GM/50ML IV SOLN
2.0000 g | INTRAVENOUS | Status: DC
  Filled 2015-02-11: qty 50

## 2015-02-11 MED ORDER — POTASSIUM CHLORIDE CRYS ER 20 MEQ PO TBCR
40.0000 meq | EXTENDED_RELEASE_TABLET | Freq: Once | ORAL | Status: AC
  Administered 2015-02-11: 40 meq via ORAL
  Filled 2015-02-11: qty 2

## 2015-02-11 MED ORDER — POTASSIUM CHLORIDE 10 MEQ/100ML IV SOLN
10.0000 meq | INTRAVENOUS | Status: DC
  Administered 2015-02-11: 10 meq via INTRAVENOUS
  Filled 2015-02-11: qty 100

## 2015-02-11 NOTE — ED Notes (Signed)
Dr Romeo AppleHarrison notified of critical potassium of 2.5

## 2015-02-11 NOTE — ED Notes (Signed)
Pt verbalized understanding of d/c instructions and has no further questions. Pt stable and in NAD.  

## 2015-02-11 NOTE — Discharge Instructions (Signed)

## 2015-02-11 NOTE — ED Notes (Signed)
Pt st's she has been having numbness and tingling off and on x's 2 days in left arm and hand.  St's today it has been constant.  Also st's at times both hands  Cramp up and she can't use them.  Pt alert and oriented at this time

## 2015-02-11 NOTE — ED Provider Notes (Signed)
CSN: [id number]     Arrival date & time 02/11/15  1555 History   First MD Initiated Contact with Patient 02/11/15 1904     Chief Complaint  Patient presents with  . Numbness     (Consider location/radiation/quality/duration/timing/severity/associated sxs/prior Treatment) HPI Comments: Patient presents to the emergency department with chief complaint of tingling in her upper extremities as well as some cramping in her hands. She states that she has had these symptoms before. She states that the symptoms have been constant. She reports having a brief episode of chest pain which has completely resolved. She denies any associated symptoms. Denies any aggravating or alleviating factors. She has not taken anything to alleviate her symptoms.  The history is provided by the patient. No language interpreter was used.    Past Medical History  Diagnosis Date  . Hypertension   . Depression   . Shortness of breath     "all my life"- rarely use  . Elevated blood sugar     "borderline diabetes"  . GERD (gastroesophageal reflux disease)   . Headache(784.0)     history migraines  . Arthritis   . Peripheral vascular disease 05/2010    DVT post op knee replacement   Past Surgical History  Procedure Laterality Date  . Joint replacement  05/2010    left knee  . Tonsillectomy    . Rectovaginal fistula closure    . Carpal tunnel release      left  . Total hip arthroplasty  07/29/2012    Procedure: TOTAL HIP ARTHROPLASTY;  Surgeon: Jacki Cones, MD;  Location: [facility];  Service: Orthopedics;  Laterality: Left;  . Total knee arthroplasty     No family history on file. History  Substance Use Topics  . Smoking status: Current Every Day Smoker -- 2.00 packs/day    Types: Cigarettes    Last Attempt to Quit: 10/22/2011  . Smokeless tobacco: Never Used  . Alcohol Use: Yes     Comment: 1-2 glasses white wine   OB History    No data available     Review of Systems  Constitutional: Negative  for fever and chills.  Respiratory: Negative for shortness of breath.   Cardiovascular: Negative for chest pain.  Gastrointestinal: Negative for nausea, vomiting, diarrhea and constipation.  Genitourinary: Negative for dysuria.  All other systems reviewed and are negative.     Allergies  Review of patient's allergies indicates no known allergies.  Home Medications   Prior to Admission medications   Medication Sig Start Date End Date Taking? Authorizing Provider  albuterol (PROVENTIL HFA;VENTOLIN HFA) 108 (90 BASE) MCG/ACT inhaler Inhale 2 puffs into the lungs every 6 (six) hours as needed. Wheezing and shortness of breath    Historical Provider, MD  amLODipine (NORVASC) 10 MG tablet Take 5 mg by mouth at bedtime. Takes 1/2 tablet    Historical Provider, MD  atorvastatin (LIPITOR) 20 MG tablet Take 20 mg by mouth at bedtime.  05/24/12   Historical Provider, MD  bisacodyl (DULCOLAX) 10 MG suppository Place 1 suppository (10 mg total) rectally daily as needed. 08/01/12   Amber Celedonio Savage, PA-C  ferrous sulfate 325 (65 FE) MG tablet Take 1 tablet (325 mg total) by mouth 3 (three) times daily after meals. 08/01/12   Amber Celedonio Savage, PA-C  FLUoxetine (PROZAC) 20 MG tablet Take 20 mg by mouth daily before breakfast.    Historical Provider, MD  hydrochlorothiazide (HYDRODIURIL) 25 MG tablet Take 25 mg by mouth daily before breakfast.  05/24/12   Historical Provider, MD  methocarbamol (ROBAXIN) 500 MG tablet Take 1 tablet (500 mg total) by mouth at bedtime. 08/01/12   Amber Celedonio Savage, PA-C  oxyCODONE-acetaminophen (PERCOCET/ROXICET) 5-325 MG per tablet Take 2 tablets by mouth every 4 (four) hours as needed (Q4-6 hours PRN). 08/01/12   Amber Constable, PA-C  pantoprazole (PROTONIX) 20 MG tablet Take 20 mg by mouth daily.    Historical Provider, MD  polyethylene glycol (MIRALAX / GLYCOLAX) packet Take 17 g by mouth daily as needed. 08/01/12   Amber Celedonio Savage, PA-C  rivaroxaban (XARELTO) 10 MG TABS  tablet Take 1 tablet (10 mg total) by mouth daily with breakfast. 08/01/12   Dimitri Ped, PA-C  zolpidem (AMBIEN) 10 MG tablet Take 10 mg by mouth at bedtime as needed. Sleep    Historical Provider, MD   BP 105/72 mmHg  Pulse 73  Temp(Src) 98.4 F (36.9 C) (Oral)  Resp 18  Ht 5\' 5"  (1.651 m)  Wt 224 lb 6 oz (101.776 kg)  BMI 37.34 kg/m2  SpO2 97% Physical Exam  Constitutional: She is oriented to person, place, and time. She appears well-developed and well-nourished.  HENT:  Head: Normocephalic and atraumatic.  Eyes: Conjunctivae and EOM are normal. Pupils are equal, round, and reactive to light.  Neck: Normal range of motion. Neck supple.  Cardiovascular: Normal rate and regular rhythm.  Exam reveals no gallop and no friction rub.   No murmur heard. Pulmonary/Chest: Effort normal and breath sounds normal. No respiratory distress. She has no wheezes. She has no rales. She exhibits no tenderness.  Abdominal: Soft. Bowel sounds are normal. She exhibits no distension and no mass. There is no tenderness. There is no rebound and no guarding.  Musculoskeletal: Normal range of motion. She exhibits no edema or tenderness.  Neurological: She is alert and oriented to person, place, and time.  Skin: Skin is warm and dry.  Psychiatric: She has a normal mood and affect. Her behavior is normal. Judgment and thought content normal.  Nursing note and vitals reviewed.   ED Course  Procedures (including critical care time) Labs Review Labs Reviewed  COMPREHENSIVE METABOLIC PANEL - Abnormal; Notable for the following:    Potassium 2.5 (*)    Chloride 95 (*)    Calcium 7.2 (*)    Albumin 3.1 (*)    Total Bilirubin 0.1 (*)    All other components within normal limits  MAGNESIUM - Abnormal; Notable for the following:    Magnesium 1.0 (*)    All other components within normal limits  CBC WITH DIFFERENTIAL/PLATELET  TROPONIN I    Imaging Review Dg Chest Port 1 View  02/11/2015   CLINICAL  DATA:  Chest tightness and shortness breath. Left-sided numbness.  EXAM: PORTABLE CHEST - 1 VIEW  COMPARISON:  07/23/2012.  FINDINGS: Poor inspiration. Grossly normal sized heart. Clear lungs with normal vascularity. Unremarkable bones.  IMPRESSION: No acute abnormality.   Electronically Signed   By: Beckie Salts M.D.   On: 02/11/2015 20:05     EKG Interpretation   Date/Time:  Thursday February 11 2015 19:30:50 EDT Ventricular Rate:  70 PR Interval:  182 QRS Duration: 114 QT Interval:  439 QTC Calculation: 474 R Axis:   53 Text Interpretation:  Sinus rhythm Borderline intraventricular conduction  delay Artifact in lead(s) I III aVR aVL aVF V1 No prior for comparison  Confirmed by Gwendolyn Grant  MD, BLAIR ([id number]) on 02/11/2015 7:54:10 PM      MDM   Final diagnoses:  Chest tightness  Numbness  Hypokalemia    Patient with numbness and tingling of bilateral upper extremities and hand cramping. She also had a fleeting episode of chest tightness. Potassium is 2.5. Will supplement this. Will check magnesium level. Magnesium level is low, will replace this. Patient seen by and discussed with Dr. Gwendolyn GrantWalden, who recommends discharge following replacement of magnesium and potassium.    Roxy Horsemanobert Yaron Grasse, PA-C 02/11/15 2224  Elwin MochaBlair Walden, MD 02/12/15 [phone]

## 2015-02-11 NOTE — ED Notes (Signed)
Dr Gwendolyn GrantWalden stated to let first run of K go in, then to replace with oral K.

## 2015-02-26 DIAGNOSIS — H52223 Regular astigmatism, bilateral: Secondary | ICD-10-CM | POA: Diagnosis not present

## 2015-02-26 DIAGNOSIS — H2513 Age-related nuclear cataract, bilateral: Secondary | ICD-10-CM | POA: Diagnosis not present

## 2015-02-26 DIAGNOSIS — H5202 Hypermetropia, left eye: Secondary | ICD-10-CM | POA: Diagnosis not present

## 2015-02-26 DIAGNOSIS — H5211 Myopia, right eye: Secondary | ICD-10-CM | POA: Diagnosis not present

## 2015-03-29 DIAGNOSIS — H2511 Age-related nuclear cataract, right eye: Secondary | ICD-10-CM | POA: Diagnosis not present

## 2015-03-29 DIAGNOSIS — H25042 Posterior subcapsular polar age-related cataract, left eye: Secondary | ICD-10-CM | POA: Diagnosis not present

## 2015-03-29 DIAGNOSIS — H02839 Dermatochalasis of unspecified eye, unspecified eyelid: Secondary | ICD-10-CM | POA: Diagnosis not present

## 2015-03-29 DIAGNOSIS — H2512 Age-related nuclear cataract, left eye: Secondary | ICD-10-CM | POA: Diagnosis not present

## 2015-03-29 DIAGNOSIS — H25012 Cortical age-related cataract, left eye: Secondary | ICD-10-CM | POA: Diagnosis not present

## 2015-03-29 DIAGNOSIS — H25011 Cortical age-related cataract, right eye: Secondary | ICD-10-CM | POA: Diagnosis not present

## 2015-04-05 ENCOUNTER — Emergency Department (HOSPITAL_COMMUNITY): Payer: Medicare Other

## 2015-04-05 ENCOUNTER — Encounter (HOSPITAL_COMMUNITY): Payer: Self-pay | Admitting: Oncology

## 2015-04-05 ENCOUNTER — Inpatient Hospital Stay (HOSPITAL_COMMUNITY)
Admission: EM | Admit: 2015-04-05 | Discharge: 2015-04-06 | DRG: 917 | Disposition: A | Discharge status: Other Acute Inpatient Hospital | Payer: Medicare Other | Attending: Internal Medicine | Admitting: Internal Medicine

## 2015-04-05 DIAGNOSIS — T50902A Poisoning by unspecified drugs, medicaments and biological substances, intentional self-harm, initial encounter: Secondary | ICD-10-CM | POA: Diagnosis present

## 2015-04-05 DIAGNOSIS — E876 Hypokalemia: Secondary | ICD-10-CM | POA: Diagnosis present

## 2015-04-05 DIAGNOSIS — F329 Major depressive disorder, single episode, unspecified: Secondary | ICD-10-CM | POA: Diagnosis not present

## 2015-04-05 DIAGNOSIS — E872 Acidosis, unspecified: Secondary | ICD-10-CM | POA: Diagnosis present

## 2015-04-05 DIAGNOSIS — F10129 Alcohol abuse with intoxication, unspecified: Secondary | ICD-10-CM | POA: Diagnosis present

## 2015-04-05 DIAGNOSIS — R9431 Abnormal electrocardiogram [ECG] [EKG]: Secondary | ICD-10-CM | POA: Diagnosis present

## 2015-04-05 DIAGNOSIS — I10 Essential (primary) hypertension: Secondary | ICD-10-CM | POA: Diagnosis present

## 2015-04-05 DIAGNOSIS — G4700 Insomnia, unspecified: Secondary | ICD-10-CM | POA: Diagnosis present

## 2015-04-05 DIAGNOSIS — Z79899 Other long term (current) drug therapy: Secondary | ICD-10-CM

## 2015-04-05 DIAGNOSIS — R680 Hypothermia, not associated with low environmental temperature: Secondary | ICD-10-CM | POA: Diagnosis present

## 2015-04-05 DIAGNOSIS — T426X2A Poisoning by other antiepileptic and sedative-hypnotic drugs, intentional self-harm, initial encounter: Principal | ICD-10-CM | POA: Diagnosis present

## 2015-04-05 DIAGNOSIS — F419 Anxiety disorder, unspecified: Secondary | ICD-10-CM | POA: Diagnosis present

## 2015-04-05 DIAGNOSIS — R45851 Suicidal ideations: Secondary | ICD-10-CM | POA: Diagnosis not present

## 2015-04-05 DIAGNOSIS — E785 Hyperlipidemia, unspecified: Secondary | ICD-10-CM | POA: Diagnosis present

## 2015-04-05 DIAGNOSIS — M1990 Unspecified osteoarthritis, unspecified site: Secondary | ICD-10-CM | POA: Diagnosis present

## 2015-04-05 DIAGNOSIS — Z86718 Personal history of other venous thrombosis and embolism: Secondary | ICD-10-CM | POA: Diagnosis not present

## 2015-04-05 DIAGNOSIS — G92 Toxic encephalopathy: Secondary | ICD-10-CM | POA: Diagnosis not present

## 2015-04-05 DIAGNOSIS — K219 Gastro-esophageal reflux disease without esophagitis: Secondary | ICD-10-CM | POA: Diagnosis present

## 2015-04-05 DIAGNOSIS — Z96652 Presence of left artificial knee joint: Secondary | ICD-10-CM | POA: Diagnosis present

## 2015-04-05 DIAGNOSIS — Z639 Problem related to primary support group, unspecified: Secondary | ICD-10-CM | POA: Diagnosis not present

## 2015-04-05 DIAGNOSIS — T68XXXD Hypothermia, subsequent encounter: Secondary | ICD-10-CM

## 2015-04-05 DIAGNOSIS — F17210 Nicotine dependence, cigarettes, uncomplicated: Secondary | ICD-10-CM | POA: Diagnosis present

## 2015-04-05 DIAGNOSIS — T68XXXA Hypothermia, initial encounter: Secondary | ICD-10-CM | POA: Diagnosis not present

## 2015-04-05 DIAGNOSIS — I4581 Long QT syndrome: Secondary | ICD-10-CM

## 2015-04-05 DIAGNOSIS — G929 Unspecified toxic encephalopathy: Secondary | ICD-10-CM | POA: Diagnosis present

## 2015-04-05 DIAGNOSIS — N179 Acute kidney failure, unspecified: Secondary | ICD-10-CM | POA: Diagnosis present

## 2015-04-05 DIAGNOSIS — Z823 Family history of stroke: Secondary | ICD-10-CM

## 2015-04-05 DIAGNOSIS — T50902D Poisoning by unspecified drugs, medicaments and biological substances, intentional self-harm, subsequent encounter: Secondary | ICD-10-CM

## 2015-04-05 DIAGNOSIS — Z7901 Long term (current) use of anticoagulants: Secondary | ICD-10-CM

## 2015-04-05 DIAGNOSIS — T50904A Poisoning by unspecified drugs, medicaments and biological substances, undetermined, initial encounter: Secondary | ICD-10-CM | POA: Diagnosis not present

## 2015-04-05 DIAGNOSIS — I739 Peripheral vascular disease, unspecified: Secondary | ICD-10-CM | POA: Diagnosis present

## 2015-04-05 DIAGNOSIS — F332 Major depressive disorder, recurrent severe without psychotic features: Secondary | ICD-10-CM

## 2015-04-05 DIAGNOSIS — T50901A Poisoning by unspecified drugs, medicaments and biological substances, accidental (unintentional), initial encounter: Secondary | ICD-10-CM | POA: Diagnosis present

## 2015-04-05 DIAGNOSIS — F32A Depression, unspecified: Secondary | ICD-10-CM | POA: Diagnosis present

## 2015-04-05 DIAGNOSIS — R401 Stupor: Secondary | ICD-10-CM | POA: Diagnosis not present

## 2015-04-05 HISTORY — DX: Acute posthemorrhagic anemia: D62

## 2015-04-05 LAB — CBC WITH DIFFERENTIAL/PLATELET
BASOS ABS: 0 10*3/uL (ref 0.0–0.1)
BASOS PCT: 0 % (ref 0–1)
Basophils Absolute: 0 10*3/uL (ref 0.0–0.1)
Basophils Relative: 1 % (ref 0–1)
EOS ABS: 0.1 10*3/uL (ref 0.0–0.7)
Eosinophils Absolute: 0.1 10*3/uL (ref 0.0–0.7)
Eosinophils Relative: 2 % (ref 0–5)
Eosinophils Relative: 1 % (ref 0–5)
HCT: 42.5 % (ref 36.0–46.0)
HEMATOCRIT: 37.1 % (ref 36.0–46.0)
Hemoglobin: 14.3 g/dL (ref 12.0–15.0)
Hemoglobin: 12.5 g/dL (ref 12.0–15.0)
LYMPHS ABS: 2.4 10*3/uL (ref 0.7–4.0)
LYMPHS PCT: 36 % (ref 12–46)
Lymphocytes Relative: 34 % (ref 12–46)
Lymphs Abs: 2.6 10*3/uL (ref 0.7–4.0)
MCH: 31.5 pg (ref 26.0–34.0)
MCH: 31.3 pg (ref 26.0–34.0)
MCHC: 33.6 g/dL (ref 30.0–36.0)
MCHC: 33.7 g/dL (ref 30.0–36.0)
MCV: 93.6 fL (ref 78.0–100.0)
MCV: 93 fL (ref 78.0–100.0)
MONO ABS: 0.6 10*3/uL (ref 0.1–1.0)
MONOS PCT: 9 % (ref 3–12)
Monocytes Absolute: 0.5 10*3/uL (ref 0.1–1.0)
Monocytes Relative: 6 % (ref 3–12)
NEUTROS ABS: 4.4 10*3/uL (ref 1.7–7.7)
NEUTROS ABS: 3.6 10*3/uL (ref 1.7–7.7)
Neutrophils Relative %: 58 % (ref 43–77)
Neutrophils Relative %: 53 % (ref 43–77)
PLATELETS: 317 10*3/uL (ref 150–400)
Platelets: 314 10*3/uL (ref 150–400)
RBC: 4.54 MIL/uL (ref 3.87–5.11)
RBC: 3.99 MIL/uL (ref 3.87–5.11)
RDW: 16 % — ABNORMAL HIGH (ref 11.5–15.5)
RDW: 16.2 % — AB (ref 11.5–15.5)
WBC: 7.7 10*3/uL (ref 4.0–10.5)
WBC: 6.6 10*3/uL (ref 4.0–10.5)

## 2015-04-05 LAB — RAPID URINE DRUG SCREEN, HOSP PERFORMED
Amphetamines: NOT DETECTED
BENZODIAZEPINES: POSITIVE — AB
Barbiturates: NOT DETECTED
Cocaine: NOT DETECTED
Opiates: NOT DETECTED
Tetrahydrocannabinol: NOT DETECTED

## 2015-04-05 LAB — COMPREHENSIVE METABOLIC PANEL
ALBUMIN: 3.1 g/dL — AB (ref 3.5–5.0)
ALT: 18 U/L (ref 14–54)
ALT: 14 U/L (ref 14–54)
ANION GAP: 10 (ref 5–15)
ANION GAP: 7 (ref 5–15)
AST: 16 U/L (ref 15–41)
AST: 15 U/L (ref 15–41)
Albumin: 3.9 g/dL (ref 3.5–5.0)
Alkaline Phosphatase: 45 U/L (ref 38–126)
Alkaline Phosphatase: 37 U/L — ABNORMAL LOW (ref 38–126)
BUN: 12 mg/dL (ref 6–20)
BUN: 11 mg/dL (ref 6–20)
CHLORIDE: 102 mmol/L (ref 101–111)
CO2: 23 mmol/L (ref 22–32)
CO2: 24 mmol/L (ref 22–32)
Calcium: 8.7 mg/dL — ABNORMAL LOW (ref 8.9–10.3)
Calcium: 7.2 mg/dL — ABNORMAL LOW (ref 8.9–10.3)
Chloride: 106 mmol/L (ref 101–111)
Creatinine, Ser: 1 mg/dL (ref 0.44–1.00)
Creatinine, Ser: 0.66 mg/dL (ref 0.44–1.00)
GFR calc Af Amer: >60 mL/min (ref >60)
GFR calc Af Amer: >60 mL/min (ref >60)
GFR calc non Af Amer: 57 mL/min — ABNORMAL LOW (ref >60)
GFR calc non Af Amer: >60 mL/min (ref >60)
GLUCOSE: 90 mg/dL (ref 65–99)
Glucose, Bld: 105 mg/dL — ABNORMAL HIGH (ref 65–99)
POTASSIUM: 3.2 mmol/L — AB (ref 3.5–5.1)
POTASSIUM: 4.3 mmol/L (ref 3.5–5.1)
SODIUM: 135 mmol/L (ref 135–145)
SODIUM: 137 mmol/L (ref 135–145)
Total Bilirubin: 0.3 mg/dL (ref 0.3–1.2)
Total Bilirubin: 0.4 mg/dL (ref 0.3–1.2)
Total Protein: 7.1 g/dL (ref 6.5–8.1)
Total Protein: 5.6 g/dL — ABNORMAL LOW (ref 6.5–8.1)

## 2015-04-05 LAB — ACETAMINOPHEN LEVEL: Acetaminophen (Tylenol), Serum: <10 ug/mL — ABNORMAL LOW (ref 10–30)

## 2015-04-05 LAB — I-STAT CHEM 8, ED
BUN: 11 mg/dL (ref 6–20)
BUN: 11 mg/dL (ref 6–20)
CREATININE: 0.7 mg/dL (ref 0.44–1.00)
Calcium, Ion: 1.11 mmol/L — ABNORMAL LOW (ref 1.13–1.30)
Calcium, Ion: 0.97 mmol/L — ABNORMAL LOW (ref 1.13–1.30)
Chloride: 98 mmol/L — ABNORMAL LOW (ref 101–111)
Chloride: 105 mmol/L (ref 101–111)
Creatinine, Ser: 1.1 mg/dL — ABNORMAL HIGH (ref 0.44–1.00)
Glucose, Bld: 103 mg/dL — ABNORMAL HIGH (ref 65–99)
Glucose, Bld: 99 mg/dL (ref 65–99)
HEMATOCRIT: 45 % (ref 36.0–46.0)
HEMATOCRIT: 38 % (ref 36.0–46.0)
Hemoglobin: 15.3 g/dL — ABNORMAL HIGH (ref 12.0–15.0)
Hemoglobin: 12.9 g/dL (ref 12.0–15.0)
Potassium: 3.1 mmol/L — ABNORMAL LOW (ref 3.5–5.1)
Potassium: 4.2 mmol/L (ref 3.5–5.1)
SODIUM: 137 mmol/L (ref 135–145)
Sodium: 137 mmol/L (ref 135–145)
TCO2: 22 mmol/L (ref 0–100)
TCO2: 21 mmol/L (ref 0–100)

## 2015-04-05 LAB — PROCALCITONIN: Procalcitonin: <0.1 ng/mL

## 2015-04-05 LAB — BLOOD GAS, VENOUS
ACID-BASE DEFICIT: 3.4 mmol/L — AB (ref 0.0–2.0)
Bicarbonate: 21.4 mEq/L (ref 20.0–24.0)
O2 Content: 3 L/min
O2 Saturation: 81.6 %
PATIENT TEMPERATURE: 98.6
PCO2 VEN: 40.1 mmHg — AB (ref 45.0–50.0)
TCO2: 18.9 mmol/L (ref 0–100)
pH, Ven: 7.347 — ABNORMAL HIGH (ref 7.250–7.300)
pO2, Ven: 48.2 mmHg — ABNORMAL HIGH (ref 30.0–45.0)

## 2015-04-05 LAB — I-STAT CG4 LACTIC ACID, ED
LACTIC ACID, VENOUS: 2.34 mmol/L — AB (ref 0.5–2.0)
Lactic Acid, Venous: 3.54 mmol/L (ref 0.5–2.0)

## 2015-04-05 LAB — URINALYSIS, ROUTINE W REFLEX MICROSCOPIC
BILIRUBIN URINE: NEGATIVE
Glucose, UA: NEGATIVE mg/dL
Hgb urine dipstick: NEGATIVE
Ketones, ur: NEGATIVE mg/dL
Leukocytes, UA: NEGATIVE
Nitrite: NEGATIVE
PROTEIN: NEGATIVE mg/dL
SPECIFIC GRAVITY, URINE: 1.016 (ref 1.005–1.030)
UROBILINOGEN UA: 0.2 mg/dL (ref 0.0–1.0)
pH: 5.5 (ref 5.0–8.0)

## 2015-04-05 LAB — GLUCOSE, CAPILLARY
GLUCOSE-CAPILLARY: 89 mg/dL (ref 65–99)
GLUCOSE-CAPILLARY: 90 mg/dL (ref 65–99)
GLUCOSE-CAPILLARY: 75 mg/dL (ref 65–99)

## 2015-04-05 LAB — CBG MONITORING, ED
GLUCOSE-CAPILLARY: 90 mg/dL (ref 65–99)
Glucose-Capillary: 101 mg/dL — ABNORMAL HIGH (ref 65–99)
Glucose-Capillary: 102 mg/dL — ABNORMAL HIGH (ref 65–99)

## 2015-04-05 LAB — PROTIME-INR
INR: 1.01 (ref 0.00–1.49)
Prothrombin Time: 13.5 seconds (ref 11.6–15.2)

## 2015-04-05 LAB — MAGNESIUM
MAGNESIUM: 2.1 mg/dL (ref 1.7–2.4)
Magnesium: 1.7 mg/dL (ref 1.7–2.4)

## 2015-04-05 LAB — I-STAT TROPONIN, ED: TROPONIN I, POC: 0 ng/mL (ref 0.00–0.08)

## 2015-04-05 LAB — ETHANOL: ALCOHOL ETHYL (B): 95 mg/dL — AB (ref <5)

## 2015-04-05 LAB — APTT: aPTT: 31 seconds (ref 24–37)

## 2015-04-05 LAB — MRSA PCR SCREENING: MRSA BY PCR: NEGATIVE

## 2015-04-05 LAB — TSH: TSH: 1.678 u[IU]/mL (ref 0.350–4.500)

## 2015-04-05 LAB — SALICYLATE LEVEL

## 2015-04-05 LAB — LACTIC ACID, PLASMA: Lactic Acid, Venous: 1.2 mmol/L (ref 0.5–2.0)

## 2015-04-05 MED ORDER — THIAMINE HCL 100 MG/ML IJ SOLN
100.0000 mg | Freq: Every day | INTRAMUSCULAR | Status: DC
  Administered 2015-04-05: 100 mg via INTRAVENOUS
  Filled 2015-04-05: qty 1
  Filled 2015-04-05: qty 2

## 2015-04-05 MED ORDER — CLONAZEPAM 0.5 MG PO TABS
0.5000 mg | ORAL_TABLET | Freq: Two times a day (BID) | ORAL | Status: DC
  Administered 2015-04-05 – 2015-04-06 (×2): 0.5 mg via ORAL
  Filled 2015-04-05 (×2): qty 1

## 2015-04-05 MED ORDER — POTASSIUM CHLORIDE 10 MEQ/100ML IV SOLN
10.0000 meq | INTRAVENOUS | Status: AC
  Administered 2015-04-05 (×4): 10 meq via INTRAVENOUS
  Filled 2015-04-05 (×5): qty 100

## 2015-04-05 MED ORDER — ENOXAPARIN SODIUM 40 MG/0.4ML ~~LOC~~ SOLN
40.0000 mg | SUBCUTANEOUS | Status: DC
  Administered 2015-04-05: 40 mg via SUBCUTANEOUS
  Filled 2015-04-05 (×2): qty 0.4

## 2015-04-05 MED ORDER — TRAZODONE HCL 50 MG PO TABS
50.0000 mg | ORAL_TABLET | Freq: Every day | ORAL | Status: DC
  Administered 2015-04-05: 50 mg via ORAL
  Filled 2015-04-05 (×2): qty 1

## 2015-04-05 MED ORDER — ONDANSETRON HCL 4 MG PO TABS: 4.0000 mg | ORAL_TABLET | Freq: Four times a day (QID) | ORAL | Status: DC | PRN

## 2015-04-05 MED ORDER — ACETAMINOPHEN 325 MG PO TABS: 650.0000 mg | ORAL_TABLET | Freq: Four times a day (QID) | ORAL | Status: DC | PRN

## 2015-04-05 MED ORDER — SODIUM CHLORIDE 0.9 % IV SOLN: INTRAVENOUS | Status: DC

## 2015-04-05 MED ORDER — SODIUM CHLORIDE 0.9 % IV BOLUS (SEPSIS)
1000.0000 mL | Freq: Once | INTRAVENOUS | Status: AC
  Administered 2015-04-05: 1000 mL via INTRAVENOUS

## 2015-04-05 MED ORDER — SODIUM CHLORIDE 0.9 % IV SOLN
INTRAVENOUS | Status: DC
  Administered 2015-04-05: 150 mL via INTRAVENOUS

## 2015-04-05 MED ORDER — INFLUENZA VAC SPLIT QUAD 0.5 ML IM SUSY
0.5000 mL | PREFILLED_SYRINGE | INTRAMUSCULAR | Status: DC
  Filled 2015-04-05 (×2): qty 0.5

## 2015-04-05 MED ORDER — CETYLPYRIDINIUM CHLORIDE 0.05 % MT LIQD: 7.0000 mL | Freq: Two times a day (BID) | OROMUCOSAL | Status: DC

## 2015-04-05 MED ORDER — SODIUM CHLORIDE 0.9 % IV BOLUS (SEPSIS): 1000.0000 mL | Freq: Once | INTRAVENOUS | Status: DC

## 2015-04-05 MED ORDER — CHLORHEXIDINE GLUCONATE 0.12 % MT SOLN
15.0000 mL | Freq: Two times a day (BID) | OROMUCOSAL | Status: DC
  Administered 2015-04-05 – 2015-04-06 (×2): 15 mL via OROMUCOSAL
  Filled 2015-04-05 (×3): qty 15

## 2015-04-05 MED ORDER — ALBUTEROL SULFATE (2.5 MG/3ML) 0.083% IN NEBU: 2.5000 mg | INHALATION_SOLUTION | Freq: Four times a day (QID) | RESPIRATORY_TRACT | Status: DC | PRN

## 2015-04-05 MED ORDER — CHARCOAL ACTIVATED PO LIQD: 50.0000 g | Freq: Once | ORAL | Status: DC

## 2015-04-05 MED ORDER — ONDANSETRON HCL 4 MG/2ML IJ SOLN: 4.0000 mg | Freq: Four times a day (QID) | INTRAMUSCULAR | Status: DC | PRN

## 2015-04-05 MED ORDER — FLUOXETINE HCL 20 MG PO CAPS
20.0000 mg | ORAL_CAPSULE | Freq: Every day | ORAL | Status: DC
  Administered 2015-04-05 – 2015-04-06 (×2): 20 mg via ORAL
  Filled 2015-04-05 (×2): qty 1

## 2015-04-05 MED ORDER — ACETAMINOPHEN 650 MG RE SUPP: 650.0000 mg | Freq: Four times a day (QID) | RECTAL | Status: DC | PRN

## 2015-04-05 NOTE — Care Management Note (Signed)
Case Management Note  Patient Details  Name: Jamie Gilmore MRN: [id number] Date of Birth: [birth year]  Subjective/Objective:                overdose    Action/Plan:Date:  April 05, 2015 U.R. performed for needs and level of care. Will continue to follow for Case Management needs.  Marcelle Smiling, RN, BSN, [place]   [phone]  Expected Discharge Date:                  Expected Discharge Plan:  Home/Self Care  In-House Referral:  Clinical Social Work  Discharge planning Services  CM Consult  Post Acute Care Choice:  NA Choice offered to:  NA  DME Arranged:    DME Agency:     HH Arranged:    HH Agency:     Status of Service:  Completed, signed off  Medicare Important Message Given:    Date Medicare IM Given:    Medicare IM give by:    Date Additional Medicare IM Given:    Additional Medicare Important Message give by:     If discussed at Long Length of Stay Meetings, dates discussed:    Additional Comments:  Golda Acre, RN 04/05/2015, 9:52 AM

## 2015-04-05 NOTE — ED Notes (Signed)
MD made aware of pt vital signs. Temp is increasing to WNL and BP is slowly rising with admin of normal saline and other prescribed meds.  See MAR for details.

## 2015-04-05 NOTE — ED Notes (Signed)
MD at bedside. 

## 2015-04-05 NOTE — ED Provider Notes (Signed)
CSN: [id number]     Arrival date & time 04/05/15  0020 History  This chart was scribed for Tomasita Crumble, MD by Freida Busman, ED Scribe. This patient was seen in room RESA/RESA and the patient's care was started 12:28 AM.    Chief Complaint  Patient presents with  . Drug Overdose  LEVEL 5 CAVEAT DUE TO Acuity of condition   The history is provided by the EMS personnel. No language interpreter was used.     HPI Comments:  Jamie Gilmore is a 69 y.o. female brought in by ambulance, who presents to the Emergency Department for a drug overdose. Per EMS pt was found by husband unresponsive she had consumed half a bottle of wine and  20 1 mg ambien tablets ~2315 this evening after having an argument with her daughter. EMS reports O2 sat of 86 on RA, they placed pt on a NRB mask and her pulse ox rose to 100%.  History limited due to acuity of condition.   Past Medical History  Diagnosis Date  . Hypertension   . Depression   . Shortness of breath     "all my life"- rarely use  . Elevated blood sugar     "borderline diabetes"  . GERD (gastroesophageal reflux disease)   . Headache(784.0)     history migraines  . Arthritis   . Peripheral vascular disease 05/2010    DVT post op knee replacement   Past Surgical History  Procedure Laterality Date  . Joint replacement  05/2010    left knee  . Tonsillectomy    . Rectovaginal fistula closure    . Carpal tunnel release      left  . Total hip arthroplasty  07/29/2012    Procedure: TOTAL HIP ARTHROPLASTY;  Surgeon: Jacki Cones, MD;  Location: [facility];  Service: Orthopedics;  Laterality: Left;  . Total knee arthroplasty     History reviewed. No pertinent family history. Social History  Substance Use Topics  . Smoking status: Current Every Day Smoker -- 2.00 packs/day    Types: Cigarettes    Last Attempt to Quit: 10/22/2011  . Smokeless tobacco: Never Used  . Alcohol Use: Yes     Comment: 1-2 glasses white wine   OB History    No  data available     Review of Systems  Unable to perform ROS: Acuity of condition      Allergies  Review of patient's allergies indicates no known allergies.  Home Medications   Prior to Admission medications   Medication Sig Start Date End Date Taking? Authorizing Provider  albuterol (PROVENTIL HFA;VENTOLIN HFA) 108 (90 BASE) MCG/ACT inhaler Inhale 2 puffs into the lungs every 6 (six) hours as needed. Wheezing and shortness of breath    Historical Provider, MD  amLODipine (NORVASC) 10 MG tablet Take 5 mg by mouth at bedtime. Takes 1/2 tablet    Historical Provider, MD  atorvastatin (LIPITOR) 20 MG tablet Take 20 mg by mouth at bedtime.  05/24/12   Historical Provider, MD  bisacodyl (DULCOLAX) 10 MG suppository Place 1 suppository (10 mg total) rectally daily as needed. 08/01/12   Amber Celedonio Savage, PA-C  ferrous sulfate 325 (65 FE) MG tablet Take 1 tablet (325 mg total) by mouth 3 (three) times daily after meals. 08/01/12   Amber Celedonio Savage, PA-C  FLUoxetine (PROZAC) 20 MG tablet Take 20 mg by mouth daily before breakfast.    Historical Provider, MD  hydrochlorothiazide (HYDRODIURIL) 25 MG tablet Take 25 mg  by mouth daily before breakfast.  05/24/12   Historical Provider, MD  methocarbamol (ROBAXIN) 500 MG tablet Take 1 tablet (500 mg total) by mouth at bedtime. 08/01/12   Amber Celedonio Savage, PA-C  oxyCODONE-acetaminophen (PERCOCET/ROXICET) 5-325 MG per tablet Take 2 tablets by mouth every 4 (four) hours as needed (Q4-6 hours PRN). 08/01/12   Amber Constable, PA-C  pantoprazole (PROTONIX) 20 MG tablet Take 20 mg by mouth daily.    Historical Provider, MD  polyethylene glycol (MIRALAX / GLYCOLAX) packet Take 17 g by mouth daily as needed. 08/01/12   Amber Constable, PA-C  potassium chloride 20 MEQ TBCR Take 40 mEq by mouth daily. 02/11/15   Roxy Horseman, PA-C  rivaroxaban (XARELTO) 10 MG TABS tablet Take 1 tablet (10 mg total) by mouth daily with breakfast. 08/01/12   Dimitri Ped, PA-C   zolpidem (AMBIEN) 10 MG tablet Take 10 mg by mouth at bedtime as needed. Sleep    Historical Provider, MD   BP 105/57 mmHg  Pulse 87  Temp(Src) 97.2 F (36.2 C)  Resp 23  SpO2 97% Physical Exam  Constitutional: She appears well-developed and well-nourished. No distress.  HENT:  Head: Normocephalic and atraumatic.  Nose: Nose normal.  Mouth/Throat: Oropharynx is clear and moist. No oropharyngeal exudate.  Nasal trumpet and [place] in place     Eyes: Conjunctivae and EOM are normal. Pupils are equal, round, and reactive to light. No scleral icterus.  Neck: Normal range of motion. Neck supple. No JVD present. No tracheal deviation present. No thyromegaly present.  Cardiovascular: Normal rate, regular rhythm and normal heart sounds.  Exam reveals no gallop and no friction rub.   No murmur heard. Pulmonary/Chest: Effort normal and breath sounds normal. No respiratory distress. She has no wheezes. She exhibits no tenderness.  Abdominal: Soft. Bowel sounds are normal. She exhibits no distension and no mass. There is no tenderness. There is no rebound and no guarding.  Musculoskeletal: Normal range of motion. She exhibits no edema or tenderness.  Lymphadenopathy:    She has no cervical adenopathy.  Neurological: GCS eye subscore is 2. GCS verbal subscore is 2. GCS motor subscore is 5.  Only arousable to physical stumuli  Skin: Skin is warm and dry. No rash noted. No erythema. No pallor.  Nursing note and vitals reviewed.   ED Course  Procedures   DIAGNOSTIC STUDIES:  Oxygen Saturation is 96% on 3L O2 [place], normal by my interpretation.    Labs Review Labs Reviewed  CBC WITH DIFFERENTIAL/PLATELET - Abnormal; Notable for the following:    RDW 16.0 (*)    All other components within normal limits  COMPREHENSIVE METABOLIC PANEL - Abnormal; Notable for the following:    Potassium 3.2 (*)    Glucose, Bld 105 (*)    Calcium 8.7 (*)    GFR calc non Af Amer 57 (*)    All other components  within normal limits  ETHANOL - Abnormal; Notable for the following:    Alcohol, Ethyl (B) 95 (*)    All other components within normal limits  URINE RAPID DRUG SCREEN, HOSP PERFORMED - Abnormal; Notable for the following:    Benzodiazepines POSITIVE (*)    All other components within normal limits  ACETAMINOPHEN LEVEL - Abnormal; Notable for the following:    Acetaminophen (Tylenol), Serum <10 (*)    All other components within normal limits  ACETAMINOPHEN LEVEL - Abnormal; Notable for the following:    Acetaminophen (Tylenol), Serum <10 (*)    All other  components within normal limits  BLOOD GAS, VENOUS - Abnormal; Notable for the following:    pH, Ven 7.347 (*)    pCO2, Ven 40.1 (*)    pO2, Ven 48.2 (*)    Acid-base deficit 3.4 (*)    All other components within normal limits  I-STAT CG4 LACTIC ACID, ED - Abnormal; Notable for the following:    Lactic Acid, Venous 3.54 (*)    All other components within normal limits  I-STAT CHEM 8, ED - Abnormal; Notable for the following:    Potassium 3.1 (*)    Chloride 98 (*)    Creatinine, Ser 1.10 (*)    Glucose, Bld 103 (*)    Calcium, Ion 1.11 (*)    Hemoglobin 15.3 (*)    All other components within normal limits  I-STAT CG4 LACTIC ACID, ED - Abnormal; Notable for the following:    Lactic Acid, Venous 2.34 (*)    All other components within normal limits  CBG MONITORING, ED - Abnormal; Notable for the following:    Glucose-Capillary 102 (*)    All other components within normal limits  CBG MONITORING, ED - Abnormal; Notable for the following:    Glucose-Capillary 101 (*)    All other components within normal limits  PROTIME-INR  URINALYSIS, ROUTINE W REFLEX MICROSCOPIC (NOT AT [facility])  SALICYLATE LEVEL  APTT  MAGNESIUM  CBG MONITORING, ED  Rosezena Sensor, ED    Imaging Review Dg Chest Port 1 View  04/05/2015   CLINICAL DATA:  Drug overdose.  Unresponsive.  EXAM: PORTABLE CHEST - 1 VIEW  COMPARISON:  02/11/2015  FINDINGS:  There are low lung volumes with mild crowding of the basilar markings. No confluent airspace opacity. No large effusion. Hilar, mediastinal and cardiac contours are unremarkable.  IMPRESSION: No active disease.   Electronically Signed   By: Ellery Plunk M.D.   On: 04/05/2015 01:29   I have personally reviewed and evaluated these images and lab results as part of my medical decision-making.   EKG Interpretation   Date/Time:  Monday April 05 2015 00:24:51 EDT Ventricular Rate:  106 PR Interval:  162 QRS Duration: 104 QT Interval:  385 QTC Calculation: 511 R Axis:   27 Text Interpretation:  Sinus tachycardia Abnormal inferior Q waves  Nonspecific repol abnormality, lateral leads Prolonged QT interval  Confirmed by Erroll Luna [phone]) on 04/05/2015 12:47:14 AM      MDM   Final diagnoses:  None    husband is now the bedside. He states that her daughter sent her a text message which made her angry.  She had had approximately 10 glasses of wine earlier that day. She then took her entire bottle of zolpidem and ingested it. He forced her to vomit immediately. 5-10 minutes later patient became more drowsy and unarousable.  He believes the patient was just acting out and is not suicidal. We'll observe the patient in the emergency department until she becomes back to baseline. TTS consult will be needed  When the patient is medically clear.   QTC is prolonged to 511, potassium was 3.1, this was replaced via IV.   Lactate is 3.5, patient is receiving IV fluids.  I spoke with Jeane at poison control who recs for 4 hour tylenol as well.   repeat lactate after 1 L of IV fluids is 2.3. Secondly of IV fluids has been ordered. Patient's blood pressure has been low to the 80s and 90s systolic. Patient's potassium was replaced with  IV.  Will re check BMP.  I will paged [facility] for admission, as it has been 4 hours and there is no return to baseline.  She will require pysch eval  when her mental status becomes clear.  Repeat EKG continues to show prolonged Qt, no signs of arrhthymias. She is still requiring O2 to maintain her sats.  4hour tylenol level is negative  CRITICAL CARE Performed by: Tomasita Crumble, MD    Total critical care time: - Drug Overdose, GCS <12  Critical care time was exclusive of separately billable procedures and treating other patients.  Critical care was necessary to treat or prevent imminent or life-threatening deterioration.  Critical care was time spent personally by me on the following activities: development of treatment plan with patient and/or surrogate as well as nursing, discussions with consultants, evaluation of patient's response to treatment, examination of patient, obtaining history from patient or surrogate, ordering and performing treatments and interventions, ordering and review of laboratory studies, ordering and review of radiographic studies, pulse oximetry and re-evaluation of patient's condition.   I personally performed the services described in this documentation, which was scribed in my presence. The recorded information has been reviewed and is accurate.    Tomasita Crumble, MD 04/05/15 0430

## 2015-04-05 NOTE — Plan of Care (Signed)
Problem: Phase I Progression Outcomes Goal: Voiding-avoid urinary catheter unless indicated Outcome: Not Progressing Patient has foley catheter     

## 2015-04-05 NOTE — ED Notes (Signed)
Pt continues to rest comfortably in bed with equal chest rise and fall noted.  Snoring is audible but oxygen saturation is maintained.  Spouse at bedside.  No acute distress evident at this time.

## 2015-04-05 NOTE — ED Notes (Addendum)
Per EMS pt was found unresponsive on her couch by her husband.  Pt took 20, 10 mg tablets of ambien and drank a bottle of wine after an argument w/ her daughter.  Pt is responsive to painful stimuli only.

## 2015-04-05 NOTE — ED Notes (Signed)
Gave MD Lactic Acid result(CG4).

## 2015-04-05 NOTE — H&P (Addendum)
[facility] History and Physical  NETANYA YAZDANI Gilmore:[id number] DOB: [birth year] DOA: 04/05/2015  Referring physician: Dr.Oni. PCP: Carilyn Goodpasture, PA-C  Specialists: None.  Chief Complaint: Drug overdose.  History obtained from ER physician Dr. Mora Bellman and patient's friend who lives with her for the last 16 years.  HPI: Jamie Gilmore is a 69 y.o. female with history of hypertension, hyperlipidemia, depression and previous history of DVT status post knee replacement was brought to the ER after patient intentionally overdosed herself with 20 tablets of Ambien 10 mg strength. By the time patient reached ER patient was drowsy. Patient took these tablets in front of her friend. Patient also was consuming alcohol at the same time. As per the friend patient had some misunderstanding with her daughter and overdosed with suicidal ideation. Patient is still lethargic and drowsy. Poison control was contacted and at this time poison control is advised to closely monitor for hypotension and worsening lethargy. In the ER patient also was mildly hypothermic and was placed on warming blankets. On exam patient is lethargic but is arousable and follows commands. Moves all extremities. As per the patient's friend other than Ambien and alcohol patient has not taken anything else.  Review of Systems: As presented in the history of presenting illness, rest negative.  Past Medical History  Diagnosis Date  . Hypertension   . Depression   . Shortness of breath     "all my life"- rarely use  . Elevated blood sugar     "borderline diabetes"  . GERD (gastroesophageal reflux disease)   . Headache(784.0)     history migraines  . Arthritis   . Peripheral vascular disease 05/2010    DVT post op knee replacement   Past Surgical History  Procedure Laterality Date  . Joint replacement  05/2010    left knee  . Tonsillectomy    . Rectovaginal fistula closure    . Carpal tunnel release      left  . Total  hip arthroplasty  07/29/2012    Procedure: TOTAL HIP ARTHROPLASTY;  Surgeon: Jacki Cones, MD;  Location: [facility];  Service: Orthopedics;  Laterality: Left;  . Total knee arthroplasty     Social History:  reports that she has been smoking Cigarettes.  She has been smoking about 2.00 packs per day. She has never used smokeless tobacco. She reports that she drinks alcohol. She reports that she does not use illicit drugs. Where does patient live home. Can patient participate in ADLs? Yes.  No Known Allergies  Family History:  Family History  Problem Relation Age of Onset  . Stroke Mother       Prior to Admission medications   Medication Sig Start Date End Date Taking? Authorizing Provider  albuterol (PROVENTIL HFA;VENTOLIN HFA) 108 (90 BASE) MCG/ACT inhaler Inhale 2 puffs into the lungs every 6 (six) hours as needed. Wheezing and shortness of breath    Historical Provider, MD  amLODipine (NORVASC) 10 MG tablet Take 5 mg by mouth at bedtime. Takes 1/2 tablet    Historical Provider, MD  atorvastatin (LIPITOR) 20 MG tablet Take 20 mg by mouth at bedtime.  05/24/12   Historical Provider, MD  bisacodyl (DULCOLAX) 10 MG suppository Place 1 suppository (10 mg total) rectally daily as needed. 08/01/12   Amber Celedonio Savage, PA-C  ferrous sulfate 325 (65 FE) MG tablet Take 1 tablet (325 mg total) by mouth 3 (three) times daily after meals. 08/01/12   Amber Constable, PA-C  FLUoxetine (PROZAC) 20 MG tablet  Take 20 mg by mouth daily before breakfast.    Historical Provider, MD  hydrochlorothiazide (HYDRODIURIL) 25 MG tablet Take 25 mg by mouth daily before breakfast.  05/24/12   Historical Provider, MD  methocarbamol (ROBAXIN) 500 MG tablet Take 1 tablet (500 mg total) by mouth at bedtime. 08/01/12   Amber Celedonio Savage, PA-C  oxyCODONE-acetaminophen (PERCOCET/ROXICET) 5-325 MG per tablet Take 2 tablets by mouth every 4 (four) hours as needed (Q4-6 hours PRN). 08/01/12   Amber Constable, PA-C  pantoprazole  (PROTONIX) 20 MG tablet Take 20 mg by mouth daily.    Historical Provider, MD  polyethylene glycol (MIRALAX / GLYCOLAX) packet Take 17 g by mouth daily as needed. 08/01/12   Amber Constable, PA-C  potassium chloride 20 MEQ TBCR Take 40 mEq by mouth daily. 02/11/15   Roxy Horseman, PA-C  rivaroxaban (XARELTO) 10 MG TABS tablet Take 1 tablet (10 mg total) by mouth daily with breakfast. 08/01/12   Dimitri Ped, PA-C  zolpidem (AMBIEN) 10 MG tablet Take 10 mg by mouth at bedtime as needed. Sleep    Historical Provider, MD    Physical Exam: Filed Vitals:   04/05/15 0415 04/05/15 0430 04/05/15 0500 04/05/15 0515  BP: 94/58 109/75 106/64 98/86  Pulse: 100 102 99 99  Temp: 98.3 F (36.8 C) 98.5 F (36.9 C) 98.9 F (37.2 C) 99.1 F (37.3 C)  Resp: 26 24 20 28   SpO2: 94% 99% 95% 92%     General:  Moderately built and nourished.  Eyes: Anicteric no pallor.  ENT: No discharge from the ears eyes nose and mouth.  Neck: No mass felt. No neck rigidity.  Cardiovascular: S1 and S2 heard.  Respiratory: No rhonchi or crepitations.  Abdomen: Soft nontender bowel sounds present.  Skin: No rash.  Musculoskeletal: No edema.  Psychiatric: Patient is lethargic.  Neurologic: Patient is lethargic.  Labs on Admission:  Basic Metabolic Panel:  Recent Labs Lab 04/05/15 0103 04/05/15 0118 04/05/15 0459  NA 135 137 137  K 3.2* 3.1* 4.2  CL 102 98* 105  CO2 23  --   --   GLUCOSE 105* 103* 99  BUN 12 11 11   CREATININE 1.00 1.10* 0.70  CALCIUM 8.7*  --   --   MG 2.1  --   --    Liver Function Tests:  Recent Labs Lab 04/05/15 0103  AST 16  ALT 18  ALKPHOS 45  BILITOT 0.3  PROT 7.1  ALBUMIN 3.9   No results for input(s): LIPASE, AMYLASE in the last 168 hours. No results for input(s): AMMONIA in the last 168 hours. CBC:  Recent Labs Lab 04/05/15 0103 04/05/15 0118 04/05/15 0459  WBC 7.7  --   --   NEUTROABS 4.4  --   --   HGB 14.3 15.3* 12.9  HCT 42.5 45.0 38.0  MCV  93.6  --   --   PLT 317  --   --    Cardiac Enzymes: No results for input(s): CKTOTAL, CKMB, CKMBINDEX, TROPONINI in the last 168 hours.  BNP (last 3 results) No results for input(s): BNP in the last 8760 hours.  ProBNP (last 3 results) No results for input(s): PROBNP in the last 8760 hours.  CBG:  Recent Labs Lab 04/05/15 0145 04/05/15 0245 04/05/15 0300  GLUCAP 90 102* 101*    Radiological Exams on Admission: Dg Chest Port 1 View  04/05/2015   CLINICAL DATA:  Drug overdose.  Unresponsive.  EXAM: PORTABLE CHEST - 1 VIEW  COMPARISON:  02/11/2015  FINDINGS: There are low lung volumes with mild crowding of the basilar markings. No confluent airspace opacity. No large effusion. Hilar, mediastinal and cardiac contours are unremarkable.  IMPRESSION: No active disease.   Electronically Signed   By: Ellery Plunk M.D.   On: 04/05/2015 01:29    EKG: Independently reviewed. Sinus tachycardia with QTC of 511 ms.  Assessment/Plan Principal Problem:   Intentional drug overdose Active Problems:   Suicidal ideation   Hypertension   Hyperlipidemia   Hypothermia   Drug overdose   1. Intentional drug overdose with suicidal ideation - patient consumed 20 tablets of 10 mg Ambien - I have discussed with poison control and at this time to have advised patient to be closely monitoring for further worsening of her mental status and also hypotension. Continue with supportive fluid management. Patient's lactic as it was elevated initially and was given 3 L of normal second bolus for which it has improved and another bolus was given and we will continue with normal saline infusion. Recheck lactic acid levels. Consult psychiatry once patient is stable. 2. Hypothermia - chest x-ray and UA unremarkable. We will check blood cultures procalcitonin levels and continue with aggressive hydration. Warming blankets and the temperature gets more than 98.76F. 3. Prolonged QT interval - closely monitor for  any arrhythmias and try to avoid any QT prolonging medications. Check electrolytes. 4. Acute encephalopathy from #1. 5. Hypertension - holding off antihypertensives since patient has consumed overdose of Ambien. See #1. Closely monitor blood pressure trends. 6. Hyperlipidemia - start statins once patient can take orally. 7. Possible COPD versus asthma - presently not wheezing.  I have reviewed patient's old charts and labs. Personally reviewed patient's chest x-ray and EKG.  DVT Prophylaxis Lovenox.  Code Status: Full code.  Family Communication: Patient's friend at the bedside. As per the friend patient had been living with him for last 16 years.  Disposition Plan: Admit to inpatient.    Kaeo Jacome N. [facility] Pager [phone].  If 7PM-7AM, please contact night-coverage [email or web address] Password [facility] 04/05/2015, 5:34 AM

## 2015-04-05 NOTE — ED Notes (Signed)
Spoke to [employer] at [employer]. He recommends monitoring 4-6 hours after arrival or until asymptomatic.  Be alert for ataxia, sedation, and mild hypotension.  Draw usual OD labs and continue to monitor.  He will call in a few hours to f/u.

## 2015-04-05 NOTE — Progress Notes (Addendum)
Progress Note   Jamie Gilmore:[id number] DOB: Oct 20, 1945 DOA: April 09, 2015 PCP: Ronnie Doss   Brief Narrative:   Jamie Gilmore is an 69 y.o. female with a PMH of hypertension, hyperlipidemia, prior DVT and depression who was admitted 04/09/15 after taking an intentional overdose of 20 tablets of Ambien 10 mg strength combined with alcohol after having an argument with her daughter. Patient was lethargic on initial presentation and mildly hypothermic.  Assessment/Plan:   Principal Problem:   Intentional drug overdose secondary to suicidal behavior/ideation / acute toxic encephalopathy - Poison control was contacted and recommended close monitoring with IV fluids. - Psychiatry consultation requested. - Denies suicidal intent this a.m., but admits she wanted to kill herself which triggered the OD. - Continue Recruitment consultant. - Transfer to floor. - Advance diet when more awake and alert.  Active Problems:   Lactic acidosis - Chest x-ray clear, no evidence of sepsis. - Lactate cleared with IV fluids.    Acute kidney injury - Resolved with IV fluids.    Hypokalemia - Resolved with supplementation.    Depression - Takes Prozac at home. Held until mental status improved and can safely take by mouth.    Prolonged QT interval - Avoid medications that prolong QT. Repeat 12 lead EKG. QTc on admission was 541 msec.    Hypothermia - Treated with warming blankets.  Temperature now WNL.    Hypertension - Antihypertensives held on admission including HCTZ and Norvasc.    Hyperlipidemia - Takes Lipitor, which is currently on hold.     DVT Prophylaxis / history of DVT - Lovenox ordered. Resume Xarelto when able to take by mouth medications.  Family Communication: Casimiro Needle, significant other, at the bedside. Disposition Plan: Home versus inpatient psychiatric facility pending psych eval.  Likely will be medically stable 04/06/15. Code Status:     Code Status  Orders        Start     Ordered   09-Apr-2015 0558  Full code   Continuous     2015-04-09 0557        IV Access:    Peripheral IV   Procedures and diagnostic studies:   Dg Chest Port 1 View  2015/04/09   CLINICAL DATA:  Drug overdose.  Unresponsive.  EXAM: PORTABLE CHEST - 1 VIEW  COMPARISON:  02/11/2015  FINDINGS: There are low lung volumes with mild crowding of the basilar markings. No confluent airspace opacity. No large effusion. Hilar, mediastinal and cardiac contours are unremarkable.  IMPRESSION: No active disease.   Electronically Signed   By: Ellery Plunk M.D.   On: 2015/04/09 01:29     Medical Consultants:    Psychiatry  Anti-Infectives:    None.  Subjective:   Jamie Gilmore is still quite lethargic but able to vocalize after being awakened with brisk stimulation.  Admits to suicide attempt yesterday after her daughter sent her an angry text accusing her of abandonment.  Some dyspnea.  RN reports a mild wheeze.  No nausea or vomiting.  Objective:    Filed Vitals:   2015-04-09 0500 04-09-15 0515 April 09, 2015 0551 April 09, 2015 0626  BP: 106/64 98/86 106/73   Pulse: 99 99 97   Temp: 98.9 F (37.2 C) 99.1 F (37.3 C)    Resp: Height:     (1.651 m)  Weight:    104 kg (229 lb 4.5 oz)  SpO2: 95% 92% 96%     Intake/Output Summary (Last 24  hours) at 04/05/15 0716 Last data filed at 04/05/15 1610  Gross per 24 hour  Intake    150 ml  Output      0 ml  Net    150 ml    Exam: Gen:  Lethargic Psych: Depressed affect Cardiovascular:  RRR, No M/R/G Respiratory:  Lungs with mild wheeze on left Gastrointestinal:  Abdomen soft, NT/ND, + BS Extremities:  No C/E/C   Data Reviewed:    Labs: Basic Metabolic Panel:  Recent Labs Lab 04/05/15 0103 04/05/15 0118 04/05/15 0459  NA 135 137 137  K 3.2* 3.1* 4.2  CL 102 98* 105  CO2 23  --   --   GLUCOSE 105* 103* 99  BUN 12 11 11   CREATININE 1.00 1.10* 0.70  CALCIUM 8.7*  --   --   MG  2.1  --   --    GFR Estimated Creatinine Clearance: 81.7 mL/min (by C-G formula based on Cr of 0.7). Liver Function Tests:  Recent Labs Lab 04/05/15 0103  AST 16  ALT 18  ALKPHOS 45  BILITOT 0.3  PROT 7.1  ALBUMIN 3.9   Coagulation profile  Recent Labs Lab 04/05/15 0103  INR 1.01    CBC:  Recent Labs Lab 04/05/15 0103 04/05/15 0118 04/05/15 0459  WBC 7.7  --   --   NEUTROABS 4.4  --   --   HGB 14.3 15.3* 12.9  HCT 42.5 45.0 38.0  MCV 93.6  --   --   PLT 317  --   --    CBG:  Recent Labs Lab 04/05/15 0145 04/05/15 0245 04/05/15 0300  GLUCAP 90 102* 101*   Sepsis Labs:  Recent Labs Lab 04/05/15 0103 04/05/15 0118 04/05/15 0308 04/05/15 0452  PROCALCITON  --   --   --  <0.10  WBC 7.7  --   --   --   LATICACIDVEN  --  3.54* 2.34*  --    Microbiology No results found for this or any previous visit (from the past 240 hour(s)).   Medications:   . enoxaparin (LOVENOX) injection  40 mg Subcutaneous Q24H  . thiamine IV  100 mg Intravenous Daily   Continuous Infusions: . sodium chloride 150 mL (04/05/15 0628)    Time spent: 30 minutes.   LOS: 0 days   RAMA,CHRISTINA  [facility] Pager [phone]. If unable to reach me by pager, please call my cell phone at [phone].  *Please refer to [email or web address], password [email or web address] to get updated schedule on who will round on this patient, as hospitalists switch teams weekly. If 7PM-7AM, please contact night-coverage at [email or web address], password [email or web address] for any overnight needs.  04/05/2015, 7:16 AM

## 2015-04-05 NOTE — ED Notes (Signed)
Pt is still sleeping with significant other at the bedside.  Hospitalist is in the room at this time discussing plans for admission. No acute distress noted.

## 2015-04-05 NOTE — ED Notes (Signed)
Pt is sleeping soundly in bed with equal chest rise and fall noted.  Oxygen saturation maintained on 3L nasal cannula.  No acute distress evident at this time.

## 2015-04-05 NOTE — Clinical Social Work Psych Assess (Signed)
Clinical Social Work Librarian, academic  Clinical Social Worker:  [place], [place] Date/Time:  04/05/2015, 1:09 PM Referred By:  Physician Date Referred:  04/05/15 Reason for Referral:  Behavioral Health Issues   Presenting Symptoms/Problems  Presenting Symptoms/Problems(in person's/family's own words):  Patient reports she was drinking wine and overdosed on pills.   Abuse/Neglect/Trauma History  Abuse/Neglect/Trauma History:  Denies History Abuse/Neglect/Trauma History Comments (indicate dates):  N/A   Psychiatric History  Psychiatric History:  Denies History Psychiatric Medication:  Prozac   Current Mental Health Hospitalizations/Previous Mental Health History:  Patient reports she has been feeling depressed since March 2016 when she lost her job.   Current Provider:  Dr. Larene Pickett and Date:  [facility]  Current Medications:   Scheduled Meds: . antiseptic oral rinse  7 mL Mouth Rinse q12n4p  . chlorhexidine  15 mL Mouth Rinse BID  . enoxaparin (LOVENOX) injection  40 mg Subcutaneous Q24H  . [START ON 04/06/2015] Influenza vac split quadrivalent PF  0.5 mL Intramuscular Tomorrow-1000  . thiamine IV  100 mg Intravenous Daily   Continuous Infusions: . sodium chloride 100 mL/hr at 04/05/15 1011   PRN Meds:.acetaminophen **OR** acetaminophen, albuterol     Previous Inpatient Admission/Date/Reason:  None reported   Emotional Health/Current Symptoms  Suicide/Self Harm: Suicide Attempt in the Past (date/description) Suicide Attempt in Past (date/description):  Patient reports she was upset with her dtr and feeling depressed and decided to overdose on medication.  Other Harmful Behavior (ex. homicidal ideation) (describe):  None reported   Psychotic/Dissociative Symptoms  Psychotic/Dissociative Symptoms: None Reported Other Psychotic/Dissociative Symptoms:  N/A   Attention/Behavioral Symptoms  Attention/Behavioral  Symptoms: Withdrawn Other Attention/Behavioral Symptoms:  Patient guarded and upset that inpatient psych treatment is being recommended.   Cognitive Impairment  Cognitive Impairment:  Within Normal Limits Other Cognitive Impairment:  Patient alert and oriented.   Mood and Adjustment  Mood and Adjustment:  Guarded, Labile   Stress, Anxiety, Trauma, Any Recent Loss/Stressor  Stress, Anxiety, Trauma, Any Recent Loss/Stressor: Relationship Anxiety (frequency):  Patient reports her PCP prescribed anti-anxiety medication but is unsure the name of medication.  Phobia (specify):  N/A  Compulsive Behavior (specify):  N/A  Obsessive Behavior (specify):  N/A  Other Stress, Anxiety, Trauma, Any Recent Loss/Stressor:  Patient and dtr have a strained relationship.   Substance Abuse/Use  Substance Abuse/Use: Current Substance Use SBIRT Completed (please refer for detailed history): Yes Self-reported Substance Use (last use and frequency):  Patient reports she drinks 1 glass of wine each night with dinner.   Urinary Drug Screen Completed: Yes Alcohol Level:  95   Environment/Housing/Living Arrangement  Environmental/Housing/Living Arrangement: Stable Housing Who is in the Home:  Boyfriend  Emergency Contact:  Michael-boyfriend   Financial  Financial: Medicare   Patient's Strengths and Goals  Patient's Strengths and Goals (patient's own words):  Patient has supportive S.O.   Clinical Social Worker's Interpretive Summary  Clinical Social Workers Interpretive Summary:    CSW and psych MD evaluated patient together. Patient in room with S.O and agreeable for S.O to be involved with assessment.  Patient and S.O have been together for the past 16 years. 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 Patient has been looking for other jobs but has been unsuccessful in her search. Patient and her dtr have had a strained relationship. Patient and  dtr had gotten into an argument the day of overdose. Patient's depression has increased due to  low self-esteem from unemployment and after arguing with dtr, patient felt family would be better off without her. Patient drank some wine and overdosed on pills. Patient told her S.O after she overdosed and was brought to the hospital.  Patient reports this was first attempt. Patient denies any previous MH treatment other than medication management from PCP. Psych MD explained that inpatient psych placement would be recommended since patient continues to endorse SI. Patient upset and reports "I will not sign any papers" in re: to going to psychiatric hospital.  MD signed IVC forms which were faxed to Magistrate. Magistrate confirmed that IVC forms were received and they will serve patient. RN aware of IVC.  CSW will continue to follow and will assist with finding inpatient placement when medically stable.   Disposition  Disposition: Inpatient Referral Made [facility], [facility], [facility])        [place], [place] [phone]

## 2015-04-05 NOTE — Consult Note (Signed)
[facility] Consult   Reason for Consult:  Depression and intentional overdose and alcohol intoxication Referring Physician:  Dr. Rockne Menghini Patient Identification: Jamie Gilmore MRN:  [id number] Principal Diagnosis: Intentional drug overdose Diagnosis:   Patient Active Problem List   Diagnosis Date Noted  . Intentional drug overdose [T50.902A] 04/05/2015  . Suicidal ideation [R45.851] 04/05/2015  . Hypertension [I10] 04/05/2015  . Hyperlipidemia [E78.5] 04/05/2015  . Hypothermia [T68.XXXA] 04/05/2015  . Drug overdose [T50.901A] 04/05/2015  . Depression [F32.9] 04/05/2015  . Prolonged Q-T interval on ECG [I45.81] 04/05/2015  . History of DVT (deep vein thrombosis) [Z86.718] 04/05/2015  . Encephalopathy, toxic [G92] 04/05/2015  . Hypokalemia [E87.6] 04/05/2015  . Acute kidney injury [N17.9] 04/05/2015  . Lactic acidosis [E87.2] 04/05/2015  . S/P left THA [Z96.60] 07/29/2012    Total Time spent with patient: 1 hour  Subjective:   JACQUI HEADEN is a 69 y.o. female patient admitted with intentional overdose.  HPI:  Jamie Gilmore is a 69 y.o. female seen with the psychiatric social service for face-to-face psychiatric consultation and evaluation of depression and status post suicidal attempt. Patient family is at bedside but did not contribute much to the history. Patient reportedly suffering with significant psychosocial stressors, financial difficulties, unable to pay bills and also has a conflict with her daughter who lives in [place]. Patient has been guarded when asked about conflict with her daughter saying it is a family issue. Patient reportedly drinks wine every day at dinner, and overdose on her sleeping medication with intention to end her life. Patient reported on her family members will be better off without her so she has decided to end her life by intentional overdose. Patient is emotional, tearful, easily upset during my evaluation and also has a poor  insight and judgment. Patient wants to leave the hospital because he did not sign any purpose to stay in the hospital. Patient has been refusing inpatient psychiatric hospitalization at this time. Patient was informed if she cannot make it appropriate decisions and also not able to keep her safe she will be involuntarily admitted to hospital. Patient blood alcohol level is 95 on arrival and urine drug screen is positive for benzodiazepine. Patient has no previous history of acute psychiatric hospitalization and does not have outpatient psychiatric medication management. Patient reportedly takes her medication for depression and anxiety and insomnia from primary care physician.  HPI Elements:   Location:  Depression and anxiety. Quality:  Poor. Severity:  Status post suicidal attempt. Timing:  Multiple psychosocial stresses. Duration:  Few days to few weeks. Context:  Psychosocial stressors, family conflicts.  Past Medical History:  Past Medical History  Diagnosis Date  . Hypertension   . Depression   . Shortness of breath     "all my life"- rarely use  . Elevated blood sugar     "borderline diabetes"  . GERD (gastroesophageal reflux disease)   . Headache(784.0)     history migraines  . Arthritis   . Peripheral vascular disease 05/2010    DVT post op knee replacement  . Acute blood loss anemia 08/01/2012    Past Surgical History  Procedure Laterality Date  . Joint replacement  05/2010    left knee  . Tonsillectomy    . Rectovaginal fistula closure    . Carpal tunnel release      left  . Total hip arthroplasty  07/29/2012    Procedure: TOTAL HIP ARTHROPLASTY;  Surgeon: Tobi Bastos, MD;  Location: [facility];  Service: Orthopedics;  Laterality: Left;  . Total knee arthroplasty     Family History:  Family History  Problem Relation Age of Onset  . Stroke Mother    Social History:  History  Alcohol Use  . Yes    Comment: 1-2 glasses white wine     History  Drug Use No     Social History   Social History  . Marital Status: Divorced    Spouse Name: N/A  . Number of Children: N/A  . Years of Education: N/A   Social History Main Topics  . Smoking status: Current Every Day Smoker -- 2.00 packs/day    Types: Cigarettes    Last Attempt to Quit: 10/22/2011  . Smokeless tobacco: Never Used  . Alcohol Use: Yes     Comment: 1-2 glasses white wine  . Drug Use: No  . Sexual Activity: Not Asked   Other Topics Concern  . None   Social History Narrative   Additional Social History:                          Allergies:  No Known Allergies  Labs:  Results for orders placed or performed during the hospital encounter of 04/05/15 (from the past 48 hour(s))  Urinalysis, Routine w reflex microscopic (not at [facility])     Status: None   Collection Time: 04/05/15 12:43 AM  Result Value Ref Range   Color, Urine YELLOW YELLOW   APPearance CLEAR CLEAR   Specific Gravity, Urine 1.016 1.005 - 1.030   pH 5.5 5.0 - 8.0   Glucose, UA NEGATIVE NEGATIVE mg/dL   Hgb urine dipstick NEGATIVE NEGATIVE   Bilirubin Urine NEGATIVE NEGATIVE   Ketones, ur NEGATIVE NEGATIVE mg/dL   Protein, ur NEGATIVE NEGATIVE mg/dL   Urobilinogen, UA 0.2 0.0 - 1.0 mg/dL   Nitrite NEGATIVE NEGATIVE   Leukocytes, UA NEGATIVE NEGATIVE    Comment: MICROSCOPIC NOT DONE ON URINES WITH NEGATIVE PROTEIN, BLOOD, LEUKOCYTES, NITRITE, OR GLUCOSE <1000 mg/dL.  Urine rapid drug screen (hosp performed)     Status: Abnormal   Collection Time: 04/05/15 12:43 AM  Result Value Ref Range   Opiates NONE DETECTED NONE DETECTED   Cocaine NONE DETECTED NONE DETECTED   Benzodiazepines POSITIVE (A) NONE DETECTED   Amphetamines NONE DETECTED NONE DETECTED   Tetrahydrocannabinol NONE DETECTED NONE DETECTED   Barbiturates NONE DETECTED NONE DETECTED    Comment:        DRUG SCREEN FOR MEDICAL PURPOSES ONLY.  IF CONFIRMATION IS NEEDED FOR ANY PURPOSE, NOTIFY LAB WITHIN 5 DAYS.        LOWEST DETECTABLE  LIMITS FOR URINE DRUG SCREEN Drug Class       Cutoff (ng/mL) Amphetamine      1000 Barbiturate      200 Benzodiazepine   536 Tricyclics       468 Opiates          300 Cocaine          300 THC              50   CBC with Differential/Platelet     Status: Abnormal   Collection Time: 04/05/15  1:03 AM  Result Value Ref Range   WBC 7.7 4.0 - 10.5 K/uL   RBC 4.54 3.87 - 5.11 MIL/uL   Hemoglobin 14.3 12.0 - 15.0 g/dL   HCT 42.5 36.0 - 46.0 %   MCV 93.6 78.0 - 100.0 fL  MCH 31.5 26.0 - 34.0 pg   MCHC 33.6 30.0 - 36.0 g/dL   RDW 16.0 (H) 11.5 - 15.5 %   Platelets 317 150 - 400 K/uL   Neutrophils Relative % 58 43 - 77 %   Neutro Abs 4.4 1.7 - 7.7 K/uL   Lymphocytes Relative 34 12 - 46 %   Lymphs Abs 2.6 0.7 - 4.0 K/uL   Monocytes Relative 6 3 - 12 %   Monocytes Absolute 0.5 0.1 - 1.0 K/uL   Eosinophils Relative 2 0 - 5 %   Eosinophils Absolute 0.1 0.0 - 0.7 K/uL   Basophils Relative 0 0 - 1 %   Basophils Absolute 0.0 0.0 - 0.1 K/uL  Comprehensive metabolic panel     Status: Abnormal   Collection Time: 04/05/15  1:03 AM  Result Value Ref Range   Sodium 135 135 - 145 mmol/L   Potassium 3.2 (L) 3.5 - 5.1 mmol/L   Chloride 102 101 - 111 mmol/L   CO2 23 22 - 32 mmol/L   Glucose, Bld 105 (H) 65 - 99 mg/dL   BUN 12 6 - 20 mg/dL   Creatinine, Ser 1.00 0.44 - 1.00 mg/dL   Calcium 8.7 (L) 8.9 - 10.3 mg/dL   Total Protein 7.1 6.5 - 8.1 g/dL   Albumin 3.9 3.5 - 5.0 g/dL   AST 16 15 - 41 U/L   ALT 18 14 - 54 U/L   Alkaline Phosphatase 45 38 - 126 U/L   Total Bilirubin 0.3 0.3 - 1.2 mg/dL   GFR calc non Af Amer 57 (L) >60 mL/min   GFR calc Af Amer >60 >60 mL/min    Comment: (NOTE) The eGFR has been calculated using the CKD EPI equation. This calculation has not been validated in all clinical situations. eGFR's persistently <60 mL/min signify possible Chronic Kidney Disease.    Anion gap 10 5 - 15  Protime-INR     Status: None   Collection Time: 04/05/15  1:03 AM  Result Value  Ref Range   Prothrombin Time 13.5 11.6 - 15.2 seconds   INR 1.01 0.00 - 1.49  Ethanol     Status: Abnormal   Collection Time: 04/05/15  1:03 AM  Result Value Ref Range   Alcohol, Ethyl (B) 95 (H) <5 mg/dL    Comment:        LOWEST DETECTABLE LIMIT FOR SERUM ALCOHOL IS 5 mg/dL FOR MEDICAL PURPOSES ONLY   Acetaminophen level     Status: Abnormal   Collection Time: 04/05/15  1:03 AM  Result Value Ref Range   Acetaminophen (Tylenol), Serum <10 (L) 10 - 30 ug/mL    Comment:        THERAPEUTIC CONCENTRATIONS VARY SIGNIFICANTLY. A RANGE OF 10-30 ug/mL MAY BE AN EFFECTIVE CONCENTRATION FOR MANY PATIENTS. HOWEVER, SOME ARE BEST TREATED AT CONCENTRATIONS OUTSIDE THIS RANGE. ACETAMINOPHEN CONCENTRATIONS >150 ug/mL AT 4 HOURS AFTER INGESTION AND >50 ug/mL AT 12 HOURS AFTER INGESTION ARE OFTEN ASSOCIATED WITH TOXIC REACTIONS.   Salicylate level     Status: None   Collection Time: 04/05/15  1:03 AM  Result Value Ref Range   Salicylate Lvl <6.5 2.8 - 30.0 mg/dL  APTT     Status: None   Collection Time: 04/05/15  1:03 AM  Result Value Ref Range   aPTT 31 24 - 37 seconds  Magnesium     Status: None   Collection Time: 04/05/15  1:03 AM  Result Value Ref Range   Magnesium  2.1 1.7 - 2.4 mg/dL  Blood gas, venous     Status: Abnormal   Collection Time: 04/05/15  1:10 AM  Result Value Ref Range   O2 Content 3.0 L/min   Delivery systems NASAL CANNULA    pH, Ven 7.347 (H) 7.250 - 7.300   pCO2, Ven 40.1 (L) 45.0 - 50.0 mmHg   pO2, Ven 48.2 (H) 30.0 - 45.0 mmHg   Bicarbonate 21.4 20.0 - 24.0 mEq/L   TCO2 18.9 0 - 100 mmol/L   Acid-base deficit 3.4 (H) 0.0 - 2.0 mmol/L   O2 Saturation 81.6 %   Patient temperature 98.6    Drawn by DRAWN BY RN    Sample type VENOUS   I-stat troponin, ED     Status: None   Collection Time: 04/05/15  1:16 AM  Result Value Ref Range   Troponin i, poc 0.00 0.00 - 0.08 ng/mL   Comment 3            Comment: Due to the release kinetics of cTnI, a negative  result within the first hours of the onset of symptoms does not rule out myocardial infarction with certainty. If myocardial infarction is still suspected, repeat the test at appropriate intervals.   I-Stat CG4 Lactic Acid, ED     Status: Abnormal   Collection Time: 04/05/15  1:18 AM  Result Value Ref Range   Lactic Acid, Venous 3.54 (HH) 0.5 - 2.0 mmol/L  I-stat chem 8, ed     Status: Abnormal   Collection Time: 04/05/15  1:18 AM  Result Value Ref Range   Sodium 137 135 - 145 mmol/L   Potassium 3.1 (L) 3.5 - 5.1 mmol/L   Chloride 98 (L) 101 - 111 mmol/L   BUN 11 6 - 20 mg/dL   Creatinine, Ser 1.10 (H) 0.44 - 1.00 mg/dL   Glucose, Bld 103 (H) 65 - 99 mg/dL   Calcium, Ion 1.11 (L) 1.13 - 1.30 mmol/L   TCO2 22 0 - 100 mmol/L   Hemoglobin 15.3 (H) 12.0 - 15.0 g/dL   HCT 45.0 36.0 - 46.0 %  CBG monitoring, ED     Status: None   Collection Time: 04/05/15  1:45 AM  Result Value Ref Range   Glucose-Capillary 90 65 - 99 mg/dL  POC CBG, ED     Status: Abnormal   Collection Time: 04/05/15  2:45 AM  Result Value Ref Range   Glucose-Capillary 102 (H) 65 - 99 mg/dL  Acetaminophen level     Status: Abnormal   Collection Time: 04/05/15  3:00 AM  Result Value Ref Range   Acetaminophen (Tylenol), Serum <10 (L) 10 - 30 ug/mL    Comment:        THERAPEUTIC CONCENTRATIONS VARY SIGNIFICANTLY. A RANGE OF 10-30 ug/mL MAY BE AN EFFECTIVE CONCENTRATION FOR MANY PATIENTS. HOWEVER, SOME ARE BEST TREATED AT CONCENTRATIONS OUTSIDE THIS RANGE. ACETAMINOPHEN CONCENTRATIONS >150 ug/mL AT 4 HOURS AFTER INGESTION AND >50 ug/mL AT 12 HOURS AFTER INGESTION ARE OFTEN ASSOCIATED WITH TOXIC REACTIONS.   CBG monitoring, ED     Status: Abnormal   Collection Time: 04/05/15  3:00 AM  Result Value Ref Range   Glucose-Capillary 101 (H) 65 - 99 mg/dL  I-Stat CG4 Lactic Acid, ED     Status: Abnormal   Collection Time: 04/05/15  3:08 AM  Result Value Ref Range   Lactic Acid, Venous 2.34 (HH) 0.5 - 2.0  mmol/L   Comment NOTIFIED PHYSICIAN   Procalcitonin - Baseline  Status: None   Collection Time: 04/05/15  4:52 AM  Result Value Ref Range   Procalcitonin <0.10 ng/mL    Comment:        Interpretation: PCT (Procalcitonin) <= 0.5 ng/mL: Systemic infection (sepsis) is not likely. Local bacterial infection is possible. (NOTE)         ICU PCT Algorithm               Non ICU PCT Algorithm    ----------------------------     ------------------------------         PCT < 0.25 ng/mL                 PCT < 0.1 ng/mL     Stopping of antibiotics            Stopping of antibiotics       strongly encouraged.               strongly encouraged.    ----------------------------     ------------------------------       PCT level decrease by               PCT < 0.25 ng/mL       >= 80% from peak PCT       OR PCT 0.25 - 0.5 ng/mL          Stopping of antibiotics                                             encouraged.     Stopping of antibiotics           encouraged.    ----------------------------     ------------------------------       PCT level decrease by              PCT >= 0.25 ng/mL       < 80% from peak PCT        AND PCT >= 0.5 ng/mL            Continuin g antibiotics                                              encouraged.       Continuing antibiotics            encouraged.    ----------------------------     ------------------------------     PCT level increase compared          PCT > 0.5 ng/mL         with peak PCT AND          PCT >= 0.5 ng/mL             Escalation of antibiotics                                          strongly encouraged.      Escalation of antibiotics        strongly encouraged.   I-stat chem 8, ed     Status: Abnormal   Collection Time: 04/05/15  4:59 AM  Result Value Ref Range   Sodium 137 135 - 145  mmol/L   Potassium 4.2 3.5 - 5.1 mmol/L   Chloride 105 101 - 111 mmol/L   BUN 11 6 - 20 mg/dL   Creatinine, Ser 0.70 0.44 - 1.00 mg/dL   Glucose, Bld 99 65 -  99 mg/dL   Calcium, Ion 0.97 (L) 1.13 - 1.30 mmol/L   TCO2 21 0 - 100 mmol/L   Hemoglobin 12.9 12.0 - 15.0 g/dL   HCT 38.0 36.0 - 46.0 %  MRSA PCR Screening     Status: None   Collection Time: 04/05/15  5:45 AM  Result Value Ref Range   MRSA by PCR NEGATIVE NEGATIVE    Comment:        The GeneXpert MRSA Assay (FDA approved for NASAL specimens only), is one component of a comprehensive MRSA colonization surveillance program. It is not intended to diagnose MRSA infection nor to guide or monitor treatment for MRSA infections.   Lactic acid, plasma     Status: None   Collection Time: 04/05/15  7:30 AM  Result Value Ref Range   Lactic Acid, Venous 1.2 0.5 - 2.0 mmol/L  Glucose, capillary     Status: None   Collection Time: 04/05/15  7:38 AM  Result Value Ref Range   Glucose-Capillary 89 65 - 99 mg/dL  Comprehensive metabolic panel     Status: Abnormal   Collection Time: 04/05/15  7:40 AM  Result Value Ref Range   Sodium 137 135 - 145 mmol/L   Potassium 4.3 3.5 - 5.1 mmol/L   Chloride 106 101 - 111 mmol/L   CO2 24 22 - 32 mmol/L   Glucose, Bld 90 65 - 99 mg/dL   BUN 11 6 - 20 mg/dL   Creatinine, Ser 0.66 0.44 - 1.00 mg/dL   Calcium 7.2 (L) 8.9 - 10.3 mg/dL   Total Protein 5.6 (L) 6.5 - 8.1 g/dL   Albumin 3.1 (L) 3.5 - 5.0 g/dL   AST 15 15 - 41 U/L   ALT 14 14 - 54 U/L   Alkaline Phosphatase 37 (L) 38 - 126 U/L   Total Bilirubin 0.4 0.3 - 1.2 mg/dL   GFR calc non Af Amer >60 >60 mL/min   GFR calc Af Amer >60 >60 mL/min    Comment: (NOTE) The eGFR has been calculated using the CKD EPI equation. This calculation has not been validated in all clinical situations. eGFR's persistently <60 mL/min signify possible Chronic Kidney Disease.    Anion gap 7 5 - 15  CBC with Differential/Platelet     Status: Abnormal   Collection Time: 04/05/15  7:40 AM  Result Value Ref Range   WBC 6.6 4.0 - 10.5 K/uL   RBC 3.99 3.87 - 5.11 MIL/uL   Hemoglobin 12.5 12.0 - 15.0 g/dL   HCT 37.1  36.0 - 46.0 %   MCV 93.0 78.0 - 100.0 fL   MCH 31.3 26.0 - 34.0 pg   MCHC 33.7 30.0 - 36.0 g/dL   RDW 16.2 (H) 11.5 - 15.5 %   Platelets 314 150 - 400 K/uL   Neutrophils Relative % 53 43 - 77 %   Neutro Abs 3.6 1.7 - 7.7 K/uL   Lymphocytes Relative 36 12 - 46 %   Lymphs Abs 2.4 0.7 - 4.0 K/uL   Monocytes Relative 9 3 - 12 %   Monocytes Absolute 0.6 0.1 - 1.0 K/uL   Eosinophils Relative 1 0 - 5 %   Eosinophils Absolute 0.1 0.0 - 0.7 K/uL   Basophils Relative  1 0 - 1 %   Basophils Absolute 0.0 0.0 - 0.1 K/uL  TSH     Status: None   Collection Time: 04/05/15  7:40 AM  Result Value Ref Range   TSH 1.678 0.350 - 4.500 uIU/mL  Magnesium     Status: None   Collection Time: 04/05/15  7:40 AM  Result Value Ref Range   Magnesium 1.7 1.7 - 2.4 mg/dL    Vitals: Blood pressure 103/69, pulse 90, temperature 98.8 F (37.1 C), temperature source Core (Comment), resp. rate 23, height 5' 5"  (1.651 m), weight 104 kg (229 lb 4.5 oz), SpO2 97 %.  Risk to Self: Is patient at risk for suicide?: Yes Risk to Others:   Prior Inpatient Therapy:   Prior Outpatient Therapy:    Current Facility-Administered Medications  Medication Dose Route Frequency Provider Last Rate Last Dose  . 0.9 %  sodium chloride infusion   Intravenous Continuous Venetia Maxon Rama, MD 100 mL/hr at 04/05/15 1011    . acetaminophen (TYLENOL) tablet 650 mg  650 mg Oral Q6H PRN Rise Patience, MD       Or  . acetaminophen (TYLENOL) suppository 650 mg  650 mg Rectal Q6H PRN Rise Patience, MD      . albuterol (PROVENTIL) (2.5 MG/3ML) 0.083% nebulizer solution 2.5 mg  2.5 mg Inhalation Q6H PRN Rise Patience, MD      . antiseptic oral rinse (CPC / CETYLPYRIDINIUM CHLORIDE 0.05%) solution 7 mL  7 mL Mouth Rinse q12n4p Christina P Rama, MD      . chlorhexidine (PERIDEX) 0.12 % solution 15 mL  15 mL Mouth Rinse BID Venetia Maxon Rama, MD   15 mL at 04/05/15 1000  . enoxaparin (LOVENOX) injection 40 mg  40 mg Subcutaneous  Q24H Rise Patience, MD   40 mg at 04/05/15 0944  . [START ON 04/06/2015] Influenza vac split quadrivalent PF (FLUARIX) injection 0.5 mL  0.5 mL Intramuscular Tomorrow-1000 Christina P Rama, MD      . thiamine (B-1) injection 100 mg  100 mg Intravenous Daily Rise Patience, MD   100 mg at 04/05/15 5038    Musculoskeletal: Strength & Muscle Tone: decreased Gait & Station: unable to stand Patient leans: N/A  Psychiatric Specialty Exam: Physical Examas per history and physical  ROS depressed, anxious, tearful more drowsy, confused but denied nausea or vomiting shortness of breath and chest pain. No Fever-chills, No Headache, No changes with Vision or hearing, reports vertigo No problems swallowing food or Liquids, No Chest pain, Cough or Shortness of Breath, No Abdominal pain, No Nausea or Vommitting, Bowel movements are regular, No Blood in stool or Urine, No dysuria, No new skin rashes or bruises, No new joints pains-aches,  No new weakness, tingling, numbness in any extremity, No recent weight gain or loss, No polyuria, polydypsia or polyphagia,   A full 10 point Review of Systems was done, except as stated above, all other Review of Systems were negative.  Blood pressure 103/69, pulse 90, temperature 98.8 F (37.1 C), temperature source Core (Comment), resp. rate 23, height 5' 5"  (1.651 m), weight 104 kg (229 lb 4.5 oz), SpO2 97 %.Body mass index is 38.15 kg/(m^2).  General Appearance: Guarded  Eye Contact::  Good  Speech:  Clear and Coherent and Slow  Volume:  Decreased  Mood:  Anxious, Depressed and Dysphoric  Affect:  Congruent, Depressed and Tearful  Thought Process:  Coherent and Goal Directed  Orientation:  Full (Time, Place, and  Person)  Thought Content:  WDL and Rumination  Suicidal Thoughts:  Yes.  with intent/plan  Homicidal Thoughts:  No  Memory:  Immediate;   Good Recent;   Good Remote;   Good  Judgement:  Impaired  Insight:  Shallow  Psychomotor  Activity:  Decreased  Concentration:  Fair  Recall:  Logan of Knowledge:Fair  Language: Good  Akathisia:  Negative  Handed:  Right  AIMS (if indicated):     Assets:  Communication Skills Desire for Improvement Housing Intimacy Leisure Time Resilience Social Support  ADL's:  Impaired  Cognition: WNL  Sleep:      Medical Decision Making: New problem, with additional work up planned, Review of Psycho-Social Stressors (1), Review or order clinical lab tests (1), Review of Last Therapy Session (1), Review or order medicine tests (1), Review of Medication Regimen & Side Effects (2) and Review of New Medication or Change in Dosage (2)  Treatment Plan Summary: Patient has been suffering with the depression, alcohol intoxication, family conflicts and status post suicidal attempt. Patient cannot contract for safety at this time. Daily contact with patient to assess and evaluate symptoms and progress in treatment and Medication management  Plan: Recommend involuntary commitment as patient is not willing to be treated mental health Hospital  Recommends fluoxetine 20 mg daily for depression, clonazepam 0.5 mg twice daily for anxiety, trazodone 50 mg at bedtime for insomnia  Recommended no Ambien - recent intentional overdose  Recommend psychiatric Inpatient admission when medically cleared. Supportive therapy provided about ongoing stressors.  Appreciate psychiatric consultation and follow up as clinically required Please contact [phone] or [phone] if needs further assistance  Disposition: Patient meet criteria for acute psychiatric hospitalization for safety concern, crisis evaluation and medication management of depression and anxiety  Peni Rupard,JANARDHAHA R. 04/05/2015 10:17 AM

## 2015-04-06 DIAGNOSIS — G43909 Migraine, unspecified, not intractable, without status migrainosus: Secondary | ICD-10-CM | POA: Diagnosis present

## 2015-04-06 DIAGNOSIS — G8929 Other chronic pain: Secondary | ICD-10-CM | POA: Diagnosis present

## 2015-04-06 DIAGNOSIS — F17200 Nicotine dependence, unspecified, uncomplicated: Secondary | ICD-10-CM | POA: Diagnosis present

## 2015-04-06 DIAGNOSIS — Z79899 Other long term (current) drug therapy: Secondary | ICD-10-CM | POA: Diagnosis not present

## 2015-04-06 DIAGNOSIS — Z96642 Presence of left artificial hip joint: Secondary | ICD-10-CM | POA: Diagnosis present

## 2015-04-06 DIAGNOSIS — Z780 Asymptomatic menopausal state: Secondary | ICD-10-CM | POA: Diagnosis not present

## 2015-04-06 DIAGNOSIS — E876 Hypokalemia: Secondary | ICD-10-CM

## 2015-04-06 DIAGNOSIS — K219 Gastro-esophageal reflux disease without esophagitis: Secondary | ICD-10-CM | POA: Diagnosis present

## 2015-04-06 DIAGNOSIS — Z79891 Long term (current) use of opiate analgesic: Secondary | ICD-10-CM | POA: Diagnosis not present

## 2015-04-06 DIAGNOSIS — F332 Major depressive disorder, recurrent severe without psychotic features: Secondary | ICD-10-CM | POA: Diagnosis not present

## 2015-04-06 DIAGNOSIS — Z96652 Presence of left artificial knee joint: Secondary | ICD-10-CM | POA: Diagnosis present

## 2015-04-06 LAB — BASIC METABOLIC PANEL
ANION GAP: 6 (ref 5–15)
BUN: 9 mg/dL (ref 6–20)
CALCIUM: 8.2 mg/dL — AB (ref 8.9–10.3)
CO2: 26 mmol/L (ref 22–32)
Chloride: 110 mmol/L (ref 101–111)
Creatinine, Ser: 0.62 mg/dL (ref 0.44–1.00)
Glucose, Bld: 93 mg/dL (ref 65–99)
Potassium: 3.6 mmol/L (ref 3.5–5.1)
Sodium: 142 mmol/L (ref 135–145)

## 2015-04-06 MED ORDER — PANTOPRAZOLE SODIUM 20 MG PO TBEC
20.0000 mg | DELAYED_RELEASE_TABLET | Freq: Every day | ORAL | Status: DC
  Administered 2015-04-06: 20 mg via ORAL
  Filled 2015-04-06: qty 1

## 2015-04-06 MED ORDER — ATORVASTATIN CALCIUM 20 MG PO TABS
20.0000 mg | ORAL_TABLET | Freq: Every day | ORAL | Status: DC
  Filled 2015-04-06: qty 1

## 2015-04-06 MED ORDER — VITAMIN B-1 100 MG PO TABS
100.0000 mg | ORAL_TABLET | Freq: Every day | ORAL | Status: DC
  Administered 2015-04-06: 100 mg via ORAL
  Filled 2015-04-06: qty 1

## 2015-04-06 MED ORDER — CLONAZEPAM 0.5 MG PO TABS: 0.5000 mg | ORAL_TABLET | Freq: Two times a day (BID) | ORAL | Status: DC

## 2015-04-06 MED ORDER — TRAZODONE HCL 50 MG PO TABS: 50.0000 mg | ORAL_TABLET | Freq: Every day | ORAL | Status: AC

## 2015-04-06 NOTE — Progress Notes (Signed)
Gave report to [employer], LPN at [facility] facility. Left number in case she has additional questions.

## 2015-04-06 NOTE — Consult Note (Signed)
[facility] Consult follow-up  Reason for Consult:  Depression and intentional overdose and alcohol intoxication Referring Physician:  Dr. Rockne Menghini Patient Identification: Jamie Gilmore MRN:  [id number] Principal Diagnosis: Intentional drug overdose Diagnosis:   Patient Active Problem List   Diagnosis Date Noted  . Intentional drug overdose [T50.902A] 04/05/2015  . Suicidal ideation [R45.851] 04/05/2015  . Hypertension [I10] 04/05/2015  . Hyperlipidemia [E78.5] 04/05/2015  . Hypothermia [T68.XXXA] 04/05/2015  . Drug overdose [T50.901A] 04/05/2015  . Depression [F32.9] 04/05/2015  . Prolonged Q-T interval on ECG [I45.81] 04/05/2015  . History of DVT (deep vein thrombosis) [Z86.718] 04/05/2015  . Encephalopathy, toxic [G92] 04/05/2015  . Hypokalemia [E87.6] 04/05/2015  . Acute kidney injury [N17.9] 04/05/2015  . Lactic acidosis [E87.2] 04/05/2015  . S/P left THA [Z96.60] 07/29/2012    Total Time spent with patient: 30 minutes  Subjective:   KORRYN PANCOAST is a 69 y.o. female patient admitted with intentional overdose.  HPI:  Jamie Gilmore is a 69 y.o. female seen with the psychiatric social service for face-to-face psychiatric consultation and evaluation of depression and status post suicidal attempt. Patient family is at bedside but did not contribute much to the history. Patient reportedly suffering with significant psychosocial stressors, financial difficulties, unable to pay bills and also has a conflict with her daughter who lives in [place]. Patient has been guarded when asked about conflict with her daughter saying it is a family issue. Patient reportedly drinks wine every day at dinner, and overdose on her sleeping medication with intention to end her life. Patient reported on her family members will be better off without her so she has decided to end her life by intentional overdose. Patient is emotional, tearful, easily upset during my evaluation and  also has a poor insight and judgment. Patient wants to leave the hospital because he did not sign any purpose to stay in the hospital. Patient has been refusing inpatient psychiatric hospitalization at this time. Patient was informed if she cannot make it appropriate decisions and also not able to keep her safe she will be involuntarily admitted to hospital. Patient blood alcohol level is 95 on arrival and urine drug screen is positive for benzodiazepine. Patient has no previous history of acute psychiatric hospitalization and does not have outpatient psychiatric medication management. Patient reportedly takes her medication for depression and anxiety and insomnia from primary care physician.  HPI Elements:   Location:  Depression and anxiety. Quality:  Poor. Severity:  Status post suicidal attempt. Timing:  Multiple psychosocial stresses. Duration:  Few days to few weeks. Context:  Psychosocial stressors, family conflicts.  Interval history: Patient seen today with the psychiatric social service for psychiatric consultation follow-up. Patient endorses symptoms of depression and anxiety and status post suicidal attempt. Patient has been compliant with her current medication management and has no side effects. Patient reported her significant other has been supportive to to her like a rock. Patient feels better since she has been hospitalized, taking medications and able to rest last night. Patient agree for acute inpatient psychiatric hospitalization for crisis stabilization and safety monitoring. Psychiatric social service reported patient may have bed available at [facility] as of yesterday. Patient was placed on involuntary commitment as patient has been reluctant to be admitted yesterday at the time of evaluation.  Past Medical History:  Past Medical History  Diagnosis Date  . Hypertension   . Depression   . Shortness of breath     "all my life"- rarely  use  . Elevated blood sugar      "borderline diabetes"  . GERD (gastroesophageal reflux disease)   . Headache(784.0)     history migraines  . Arthritis   . Peripheral vascular disease 05/2010    DVT post op knee replacement  . Acute blood loss anemia 08/01/2012    Past Surgical History  Procedure Laterality Date  . Joint replacement  05/2010    left knee  . Tonsillectomy    . Rectovaginal fistula closure    . Carpal tunnel release      left  . Total hip arthroplasty  07/29/2012    Procedure: TOTAL HIP ARTHROPLASTY;  Surgeon: Tobi Bastos, MD;  Location: [facility];  Service: Orthopedics;  Laterality: Left;  . Total knee arthroplasty     Family History:  Family History  Problem Relation Age of Onset  . Stroke Mother    Social History:  History  Alcohol Use  . Yes    Comment: 1-2 glasses white wine     History  Drug Use No    Social History   Social History  . Marital Status: Divorced    Spouse Name: N/A  . Number of Children: N/A  . Years of Education: N/A   Social History Main Topics  . Smoking status: Current Every Day Smoker -- 2.00 packs/day    Types: Cigarettes    Last Attempt to Quit: 10/22/2011  . Smokeless tobacco: Never Used  . Alcohol Use: Yes     Comment: 1-2 glasses white wine  . Drug Use: No  . Sexual Activity: Not Asked   Other Topics Concern  . None   Social History Narrative   Additional Social History:                          Allergies:  No Known Allergies  Labs:  Results for orders placed or performed during the hospital encounter of 04/05/15 (from the past 48 hour(s))  Urinalysis, Routine w reflex microscopic (not at [facility])     Status: None   Collection Time: 04/05/15 12:43 AM  Result Value Ref Range   Color, Urine YELLOW YELLOW   APPearance CLEAR CLEAR   Specific Gravity, Urine 1.016 1.005 - 1.030   pH 5.5 5.0 - 8.0   Glucose, UA NEGATIVE NEGATIVE mg/dL   Hgb urine dipstick NEGATIVE NEGATIVE   Bilirubin Urine NEGATIVE NEGATIVE   Ketones, ur  NEGATIVE NEGATIVE mg/dL   Protein, ur NEGATIVE NEGATIVE mg/dL   Urobilinogen, UA 0.2 0.0 - 1.0 mg/dL   Nitrite NEGATIVE NEGATIVE   Leukocytes, UA NEGATIVE NEGATIVE    Comment: MICROSCOPIC NOT DONE ON URINES WITH NEGATIVE PROTEIN, BLOOD, LEUKOCYTES, NITRITE, OR GLUCOSE <1000 mg/dL.  Urine rapid drug screen (hosp performed)     Status: Abnormal   Collection Time: 04/05/15 12:43 AM  Result Value Ref Range   Opiates NONE DETECTED NONE DETECTED   Cocaine NONE DETECTED NONE DETECTED   Benzodiazepines POSITIVE (A) NONE DETECTED   Amphetamines NONE DETECTED NONE DETECTED   Tetrahydrocannabinol NONE DETECTED NONE DETECTED   Barbiturates NONE DETECTED NONE DETECTED    Comment:        DRUG SCREEN FOR MEDICAL PURPOSES ONLY.  IF CONFIRMATION IS NEEDED FOR ANY PURPOSE, NOTIFY LAB WITHIN 5 DAYS.        LOWEST DETECTABLE LIMITS FOR URINE DRUG SCREEN Drug Class       Cutoff (ng/mL) Amphetamine      1000 Barbiturate  200 Benzodiazepine   456 Tricyclics       256 Opiates          300 Cocaine          300 THC              50   CBC with Differential/Platelet     Status: Abnormal   Collection Time: 04/05/15  1:03 AM  Result Value Ref Range   WBC 7.7 4.0 - 10.5 K/uL   RBC 4.54 3.87 - 5.11 MIL/uL   Hemoglobin 14.3 12.0 - 15.0 g/dL   HCT 42.5 36.0 - 46.0 %   MCV 93.6 78.0 - 100.0 fL   MCH 31.5 26.0 - 34.0 pg   MCHC 33.6 30.0 - 36.0 g/dL   RDW 16.0 (H) 11.5 - 15.5 %   Platelets 317 150 - 400 K/uL   Neutrophils Relative % 58 43 - 77 %   Neutro Abs 4.4 1.7 - 7.7 K/uL   Lymphocytes Relative 34 12 - 46 %   Lymphs Abs 2.6 0.7 - 4.0 K/uL   Monocytes Relative 6 3 - 12 %   Monocytes Absolute 0.5 0.1 - 1.0 K/uL   Eosinophils Relative 2 0 - 5 %   Eosinophils Absolute 0.1 0.0 - 0.7 K/uL   Basophils Relative 0 0 - 1 %   Basophils Absolute 0.0 0.0 - 0.1 K/uL  Comprehensive metabolic panel     Status: Abnormal   Collection Time: 04/05/15  1:03 AM  Result Value Ref Range   Sodium 135 135 - 145  mmol/L   Potassium 3.2 (L) 3.5 - 5.1 mmol/L   Chloride 102 101 - 111 mmol/L   CO2 23 22 - 32 mmol/L   Glucose, Bld 105 (H) 65 - 99 mg/dL   BUN 12 6 - 20 mg/dL   Creatinine, Ser 1.00 0.44 - 1.00 mg/dL   Calcium 8.7 (L) 8.9 - 10.3 mg/dL   Total Protein 7.1 6.5 - 8.1 g/dL   Albumin 3.9 3.5 - 5.0 g/dL   AST 16 15 - 41 U/L   ALT 18 14 - 54 U/L   Alkaline Phosphatase 45 38 - 126 U/L   Total Bilirubin 0.3 0.3 - 1.2 mg/dL   GFR calc non Af Amer 57 (L) >60 mL/min   GFR calc Af Amer >60 >60 mL/min    Comment: (NOTE) The eGFR has been calculated using the CKD EPI equation. This calculation has not been validated in all clinical situations. eGFR's persistently <60 mL/min signify possible Chronic Kidney Disease.    Anion gap 10 5 - 15  Protime-INR     Status: None   Collection Time: 04/05/15  1:03 AM  Result Value Ref Range   Prothrombin Time 13.5 11.6 - 15.2 seconds   INR 1.01 0.00 - 1.49  Ethanol     Status: Abnormal   Collection Time: 04/05/15  1:03 AM  Result Value Ref Range   Alcohol, Ethyl (B) 95 (H) <5 mg/dL    Comment:        LOWEST DETECTABLE LIMIT FOR SERUM ALCOHOL IS 5 mg/dL FOR MEDICAL PURPOSES ONLY   Acetaminophen level     Status: Abnormal   Collection Time: 04/05/15  1:03 AM  Result Value Ref Range   Acetaminophen (Tylenol), Serum <10 (L) 10 - 30 ug/mL    Comment:        THERAPEUTIC CONCENTRATIONS VARY SIGNIFICANTLY. A RANGE OF 10-30 ug/mL MAY BE AN EFFECTIVE CONCENTRATION FOR MANY PATIENTS. HOWEVER, SOME  ARE BEST TREATED AT CONCENTRATIONS OUTSIDE THIS RANGE. ACETAMINOPHEN CONCENTRATIONS >150 ug/mL AT 4 HOURS AFTER INGESTION AND >50 ug/mL AT 12 HOURS AFTER INGESTION ARE OFTEN ASSOCIATED WITH TOXIC REACTIONS.   Salicylate level     Status: None   Collection Time: 04/05/15  1:03 AM  Result Value Ref Range   Salicylate Lvl <4.0 2.8 - 30.0 mg/dL  APTT     Status: None   Collection Time: 04/05/15  1:03 AM  Result Value Ref Range   aPTT 31 24 - 37 seconds   Magnesium     Status: None   Collection Time: 04/05/15  1:03 AM  Result Value Ref Range   Magnesium 2.1 1.7 - 2.4 mg/dL  Blood gas, venous     Status: Abnormal   Collection Time: 04/05/15  1:10 AM  Result Value Ref Range   O2 Content 3.0 L/min   Delivery systems NASAL CANNULA    pH, Ven 7.347 (H) 7.250 - 7.300   pCO2, Ven 40.1 (L) 45.0 - 50.0 mmHg   pO2, Ven 48.2 (H) 30.0 - 45.0 mmHg   Bicarbonate 21.4 20.0 - 24.0 mEq/L   TCO2 18.9 0 - 100 mmol/L   Acid-base deficit 3.4 (H) 0.0 - 2.0 mmol/L   O2 Saturation 81.6 %   Patient temperature 98.6    Drawn by DRAWN BY RN    Sample type VENOUS   I-stat troponin, ED     Status: None   Collection Time: 04/05/15  1:16 AM  Result Value Ref Range   Troponin i, poc 0.00 0.00 - 0.08 ng/mL   Comment 3            Comment: Due to the release kinetics of cTnI, a negative result within the first hours of the onset of symptoms does not rule out myocardial infarction with certainty. If myocardial infarction is still suspected, repeat the test at appropriate intervals.   I-Stat CG4 Lactic Acid, ED     Status: Abnormal   Collection Time: 04/05/15  1:18 AM  Result Value Ref Range   Lactic Acid, Venous 3.54 (HH) 0.5 - 2.0 mmol/L  I-stat chem 8, ed     Status: Abnormal   Collection Time: 04/05/15  1:18 AM  Result Value Ref Range   Sodium 137 135 - 145 mmol/L   Potassium 3.1 (L) 3.5 - 5.1 mmol/L   Chloride 98 (L) 101 - 111 mmol/L   BUN 11 6 - 20 mg/dL   Creatinine, Ser 1.10 (H) 0.44 - 1.00 mg/dL   Glucose, Bld 103 (H) 65 - 99 mg/dL   Calcium, Ion 1.11 (L) 1.13 - 1.30 mmol/L   TCO2 22 0 - 100 mmol/L   Hemoglobin 15.3 (H) 12.0 - 15.0 g/dL   HCT 45.0 36.0 - 46.0 %  CBG monitoring, ED     Status: None   Collection Time: 04/05/15  1:45 AM  Result Value Ref Range   Glucose-Capillary 90 65 - 99 mg/dL  POC CBG, ED     Status: Abnormal   Collection Time: 04/05/15  2:45 AM  Result Value Ref Range   Glucose-Capillary 102 (H) 65 - 99 mg/dL   Acetaminophen level     Status: Abnormal   Collection Time: 04/05/15  3:00 AM  Result Value Ref Range   Acetaminophen (Tylenol), Serum <10 (L) 10 - 30 ug/mL    Comment:        THERAPEUTIC CONCENTRATIONS VARY SIGNIFICANTLY. A RANGE OF 10-30 ug/mL MAY BE AN EFFECTIVE CONCENTRATION FOR  MANY PATIENTS. HOWEVER, SOME ARE BEST TREATED AT CONCENTRATIONS OUTSIDE THIS RANGE. ACETAMINOPHEN CONCENTRATIONS >150 ug/mL AT 4 HOURS AFTER INGESTION AND >50 ug/mL AT 12 HOURS AFTER INGESTION ARE OFTEN ASSOCIATED WITH TOXIC REACTIONS.   CBG monitoring, ED     Status: Abnormal   Collection Time: 04/05/15  3:00 AM  Result Value Ref Range   Glucose-Capillary 101 (H) 65 - 99 mg/dL  I-Stat CG4 Lactic Acid, ED     Status: Abnormal   Collection Time: 04/05/15  3:08 AM  Result Value Ref Range   Lactic Acid, Venous 2.34 (HH) 0.5 - 2.0 mmol/L   Comment NOTIFIED PHYSICIAN   Procalcitonin - Baseline     Status: None   Collection Time: 04/05/15  4:52 AM  Result Value Ref Range   Procalcitonin <0.10 ng/mL    Comment:        Interpretation: PCT (Procalcitonin) <= 0.5 ng/mL: Systemic infection (sepsis) is not likely. Local bacterial infection is possible. (NOTE)         ICU PCT Algorithm               Non ICU PCT Algorithm    ----------------------------     ------------------------------         PCT < 0.25 ng/mL                 PCT < 0.1 ng/mL     Stopping of antibiotics            Stopping of antibiotics       strongly encouraged.               strongly encouraged.    ----------------------------     ------------------------------       PCT level decrease by               PCT < 0.25 ng/mL       >= 80% from peak PCT       OR PCT 0.25 - 0.5 ng/mL          Stopping of antibiotics                                             encouraged.     Stopping of antibiotics           encouraged.    ----------------------------     ------------------------------       PCT level decrease by              PCT >= 0.25  ng/mL       < 80% from peak PCT        AND PCT >= 0.5 ng/mL            Continuin g antibiotics                                              encouraged.       Continuing antibiotics            encouraged.    ----------------------------     ------------------------------     PCT level increase compared          PCT > 0.5 ng/mL         with peak PCT AND  PCT >= 0.5 ng/mL             Escalation of antibiotics                                          strongly encouraged.      Escalation of antibiotics        strongly encouraged.   I-stat chem 8, ed     Status: Abnormal   Collection Time: 04/05/15  4:59 AM  Result Value Ref Range   Sodium 137 135 - 145 mmol/L   Potassium 4.2 3.5 - 5.1 mmol/L   Chloride 105 101 - 111 mmol/L   BUN 11 6 - 20 mg/dL   Creatinine, Ser 0.70 0.44 - 1.00 mg/dL   Glucose, Bld 99 65 - 99 mg/dL   Calcium, Ion 0.97 (L) 1.13 - 1.30 mmol/L   TCO2 21 0 - 100 mmol/L   Hemoglobin 12.9 12.0 - 15.0 g/dL   HCT 38.0 36.0 - 46.0 %  MRSA PCR Screening     Status: None   Collection Time: 04/05/15  5:45 AM  Result Value Ref Range   MRSA by PCR NEGATIVE NEGATIVE    Comment:        The GeneXpert MRSA Assay (FDA approved for NASAL specimens only), is one component of a comprehensive MRSA colonization surveillance program. It is not intended to diagnose MRSA infection nor to guide or monitor treatment for MRSA infections.   Lactic acid, plasma     Status: None   Collection Time: 04/05/15  7:30 AM  Result Value Ref Range   Lactic Acid, Venous 1.2 0.5 - 2.0 mmol/L  Glucose, capillary     Status: None   Collection Time: 04/05/15  7:38 AM  Result Value Ref Range   Glucose-Capillary 89 65 - 99 mg/dL  Comprehensive metabolic panel     Status: Abnormal   Collection Time: 04/05/15  7:40 AM  Result Value Ref Range   Sodium 137 135 - 145 mmol/L   Potassium 4.3 3.5 - 5.1 mmol/L   Chloride 106 101 - 111 mmol/L   CO2 24 22 - 32 mmol/L   Glucose, Bld 90 65 - 99  mg/dL   BUN 11 6 - 20 mg/dL   Creatinine, Ser 0.66 0.44 - 1.00 mg/dL   Calcium 7.2 (L) 8.9 - 10.3 mg/dL   Total Protein 5.6 (L) 6.5 - 8.1 g/dL   Albumin 3.1 (L) 3.5 - 5.0 g/dL   AST 15 15 - 41 U/L   ALT 14 14 - 54 U/L   Alkaline Phosphatase 37 (L) 38 - 126 U/L   Total Bilirubin 0.4 0.3 - 1.2 mg/dL   GFR calc non Af Amer >60 >60 mL/min   GFR calc Af Amer >60 >60 mL/min    Comment: (NOTE) The eGFR has been calculated using the CKD EPI equation. This calculation has not been validated in all clinical situations. eGFR's persistently <60 mL/min signify possible Chronic Kidney Disease.    Anion gap 7 5 - 15  CBC with Differential/Platelet     Status: Abnormal   Collection Time: 04/05/15  7:40 AM  Result Value Ref Range   WBC 6.6 4.0 - 10.5 K/uL   RBC 3.99 3.87 - 5.11 MIL/uL   Hemoglobin 12.5 12.0 - 15.0 g/dL   HCT 37.1 36.0 - 46.0 %   MCV 93.0 78.0 - 100.0 fL  MCH 31.3 26.0 - 34.0 pg   MCHC 33.7 30.0 - 36.0 g/dL   RDW 16.2 (H) 11.5 - 15.5 %   Platelets 314 150 - 400 K/uL   Neutrophils Relative % 53 43 - 77 %   Neutro Abs 3.6 1.7 - 7.7 K/uL   Lymphocytes Relative 36 12 - 46 %   Lymphs Abs 2.4 0.7 - 4.0 K/uL   Monocytes Relative 9 3 - 12 %   Monocytes Absolute 0.6 0.1 - 1.0 K/uL   Eosinophils Relative 1 0 - 5 %   Eosinophils Absolute 0.1 0.0 - 0.7 K/uL   Basophils Relative 1 0 - 1 %   Basophils Absolute 0.0 0.0 - 0.1 K/uL  TSH     Status: None   Collection Time: 04/05/15  7:40 AM  Result Value Ref Range   TSH 1.678 0.350 - 4.500 uIU/mL  Magnesium     Status: None   Collection Time: 04/05/15  7:40 AM  Result Value Ref Range   Magnesium 1.7 1.7 - 2.4 mg/dL  Glucose, capillary     Status: None   Collection Time: 04/05/15 12:11 PM  Result Value Ref Range   Glucose-Capillary 90 65 - 99 mg/dL  Glucose, capillary     Status: None   Collection Time: 04/05/15  4:54 PM  Result Value Ref Range   Glucose-Capillary 75 65 - 99 mg/dL  Basic metabolic panel     Status: Abnormal    Collection Time: 04/06/15  5:00 AM  Result Value Ref Range   Sodium 142 135 - 145 mmol/L   Potassium 3.6 3.5 - 5.1 mmol/L   Chloride 110 101 - 111 mmol/L   CO2 26 22 - 32 mmol/L   Glucose, Bld 93 65 - 99 mg/dL   BUN 9 6 - 20 mg/dL   Creatinine, Ser 0.62 0.44 - 1.00 mg/dL   Calcium 8.2 (L) 8.9 - 10.3 mg/dL   GFR calc non Af Amer >60 >60 mL/min   GFR calc Af Amer >60 >60 mL/min    Comment: (NOTE) The eGFR has been calculated using the CKD EPI equation. This calculation has not been validated in all clinical situations. eGFR's persistently <60 mL/min signify possible Chronic Kidney Disease.    Anion gap 6 5 - 15    Vitals: Blood pressure 131/69, pulse 60, temperature 97.7 F (36.5 C), temperature source Oral, resp. rate 16, height _0  (1.651 m), weight 105.688 kg (233 lb), SpO2 99 %.  Risk to Self: Is patient at risk for suicide?: Yes Risk to Others:   Prior Inpatient Therapy:   Prior Outpatient Therapy:    Current Facility-Administered Medications  Medication Dose Route Frequency Provider Last Rate Last Dose  . acetaminophen (TYLENOL) tablet 650 mg  650 mg Oral Q6H PRN Rise Patience, MD       Or  . acetaminophen (TYLENOL) suppository 650 mg  650 mg Rectal Q6H PRN Rise Patience, MD      . albuterol (PROVENTIL) (2.5 MG/3ML) 0.083% nebulizer solution 2.5 mg  2.5 mg Inhalation Q6H PRN Rise Patience, MD      . antiseptic oral rinse (CPC / CETYLPYRIDINIUM CHLORIDE 0.05%) solution 7 mL  7 mL Mouth Rinse q12n4p Christina P Rama, MD      . atorvastatin (LIPITOR) tablet 20 mg  20 mg Oral QHS Christina P Rama, MD      . chlorhexidine (PERIDEX) 0.12 % solution 15 mL  15 mL Mouth Rinse BID Christina P Rama,  MD   15 mL at 04/06/15 0952  . clonazePAM (KLONOPIN) tablet 0.5 mg  0.5 mg Oral BID Ambrose Finland, MD   0.5 mg at 04/06/15 0951  . enoxaparin (LOVENOX) injection 40 mg  40 mg Subcutaneous Q24H Rise Patience, MD   40 mg at 04/05/15 0944  . FLUoxetine  (PROZAC) capsule 20 mg  20 mg Oral Daily Ambrose Finland, MD   20 mg at 04/06/15 0951  . Influenza vac split quadrivalent PF (FLUARIX) injection 0.5 mL  0.5 mL Intramuscular Tomorrow-1000 Christina P Rama, MD   0.5 mL at 04/06/15 1000  . pantoprazole (PROTONIX) EC tablet 20 mg  20 mg Oral Daily Christina P Rama, MD      . thiamine (VITAMIN B-1) tablet 100 mg  100 mg Oral Daily Venetia Maxon Rama, MD   100 mg at 04/06/15 0951  . traZODone (DESYREL) tablet 50 mg  50 mg Oral QHS Ambrose Finland, MD   50 mg at 04/05/15 2114    Musculoskeletal: Strength & Muscle Tone: decreased Gait & Station: unable to stand Patient leans: N/A  Psychiatric Specialty Exam: Physical Examas per history and physical  ROS   Blood pressure 131/69, pulse 60, temperature 97.7 F (36.5 C), temperature source Oral, resp. rate 16, height _0  (1.651 m), weight 105.688 kg (233 lb), SpO2 99 %.Body mass index is 38.77 kg/(m^2).  General Appearance: Guarded  Eye Contact::  Good  Speech:  Clear and Coherent and Slow  Volume:  Decreased  Mood:  Anxious, Depressed and Dysphoric  Affect:  Congruent, Depressed and Tearful  Thought Process:  Coherent and Goal Directed  Orientation:  Full (Time, Place, and Person)  Thought Content:  WDL and Rumination  Suicidal Thoughts:  Yes.  with intent/plan  Homicidal Thoughts:  No  Memory:  Immediate;   Good Recent;   Good Remote;   Good  Judgement:  Impaired  Insight:  Shallow  Psychomotor Activity:  Decreased  Concentration:  Fair  Recall:  [employer] of Knowledge:Fair  Language: Good  Akathisia:  Negative  Handed:  Right  AIMS (if indicated):     Assets:  Communication Skills Desire for Improvement Housing Intimacy Leisure Time Resilience Social Support  ADL's:  Impaired  Cognition: WNL  Sleep:      Medical Decision Making: New problem, with additional work up planned, Review of Psycho-Social Stressors (1), Review or order clinical lab tests (1),  Review of Last Therapy Session (1), Review or order medicine tests (1), Review of Medication Regimen & Side Effects (2) and Review of New Medication or Change in Dosage (2)  Treatment Plan Summary: Patient has been suffering with the depression, alcohol intoxication, family conflicts and status post suicidal attempt. Patient cannot contract for safety at this time. Patient continued to meet inpatient psychiatric hospitalization criteria. Daily contact with patient to assess and evaluate symptoms and progress in treatment and Medication management  Plan: Recommend involuntary commitment as patient is not willing to be treated mental health Hospital  Continue fluoxetine 20 mg daily for depression, clonazepam 0.5 mg twice daily for anxiety, trazodone 50 mg at bedtime for insomnia  Recommend psychiatric Inpatient admission when medically cleared. Supportive therapy provided about ongoing stressors.  Appreciate psychiatric consultation and follow up as clinically required Please contact [phone] or [phone] if needs further assistance  Disposition: Patient meet criteria for acute psychiatric hospitalization for safety concern, crisis evaluation and medication management of depression and anxiety  Yaeko Fazekas,JANARDHAHA R. 04/06/2015 1:45 PM

## 2015-04-06 NOTE — Progress Notes (Signed)
Clinical Social Work  CSW spoke with MD who reports patient is medically stable. CSW contacted the following facilities:  [facility]- no available beds  [facility]-  Unable to reach AC-will continue to call.  [facility]- per admissions, patient would need geriatric bed but no female geri beds available today.  [facility]- available beds. Referral faxed.  [facility]- available beds. Referral faxed.  CSW will continue to follow.  [place], [place] [phone]

## 2015-04-06 NOTE — Progress Notes (Signed)
Progress Note   Jamie Gilmore:[id number] DOB: September 07, 1945 DOA: 05/01/15 PCP: Ronnie Doss   Brief Narrative:   Jamie Gilmore is an 69 y.o. female with a PMH of hypertension, hyperlipidemia, prior DVT and depression who was admitted May 01, 2015 after taking an intentional overdose of 20 tablets of Ambien 10 mg strength combined with alcohol after having an argument with her daughter. Patient was lethargic on initial presentation and mildly hypothermic. Fully awake and alert now, medically stable for D/C.  Assessment/Plan:   Principal Problem:   Intentional drug overdose secondary to suicidal behavior/ideation / acute toxic encephalopathy - Poison control was contacted and recommended close monitoring with IV fluids. - Psychiatry consultation performed 2015/05/01 with recommendations for inpatient psychiatry. - Involuntarily committed May 01, 2015 after she attempted to leave AMA. - Patent attorney.  Now requesting outpatient treatment.  Active Problems:   Lactic acidosis - Chest x-ray clear, no evidence of sepsis. - Lactate cleared with IV fluids.    Acute kidney injury - Resolved with IV fluids.    Hypokalemia - Resolved with supplementation.    Depression - Started on Prozac 20 mg daily, clonazepam 0.5 mg twice a day and trazodone 50 mg daily at bedtime per psychiatry.    Prolonged QT interval - Avoid medications that prolong QT. Repeat 12 lead showed normalization of QTc and telemetry subsequently discontinued.    Hypothermia - Resolved.    Hypertension - Antihypertensives held on admission including HCTZ and Norvasc.    Hyperlipidemia - Resume Lipitor.     DVT Prophylaxis / history of DVT - Lovenox ordered.   Family Communication: Casimiro Needle, significant other, at the bedside. Disposition Plan: Home versus inpatient psychiatric facility. Medically stable. Code Status:     Code Status Orders        Start     Ordered   May 01, 2015 0558  Full  code   Continuous     May 01, 2015 0557        IV Access:    Peripheral IV   Procedures and diagnostic studies:   Dg Chest Port 1 View  May 01, 2015   CLINICAL DATA:  Drug overdose.  Unresponsive.  EXAM: PORTABLE CHEST - 1 VIEW  COMPARISON:  02/11/2015  FINDINGS: There are low lung volumes with mild crowding of the basilar markings. No confluent airspace opacity. No large effusion. Hilar, mediastinal and cardiac contours are unremarkable.  IMPRESSION: No active disease.   Electronically Signed   By: Ellery Plunk M.D.   On: 01-May-2015 01:29     Medical Consultants:    Psychiatry  Anti-Infectives:    None.  Subjective:   Jamie Gilmore is awake and alert, wants to go home.  Denies active SI/HI.  Says she would be compliant with outpatient treatment.  Objective:    Filed Vitals:   May 01, 2015 1159 05/01/15 1900 01-May-2015 2131 04/06/15 0615  BP: 130/87 126/74 128/76 131/69  Pulse: 90 73 76 60  Temp: 98.1 F (36.7 C) 98.2 F (36.8 C) 98 F (36.7 C) 97.7 F (36.5 C)  TempSrc: Oral Oral Oral Oral  Resp: Height:      Weight:      SpO2: 96% 96% 98% 99%    Intake/Output Summary (Last 24 hours) at 04/06/15 0820 Last data filed at May 01, 2015 1457  Gross per 24 hour  Intake      0 ml  Output   2475 ml  Net  -2475 ml    Exam: Gen:  Awake and alert Psych: Mood stable Cardiovascular:  RRR, No M/R/G Respiratory:  Lungs CTAB Gastrointestinal:  Abdomen soft, NT/ND, + BS Extremities:  No C/E/C   Data Reviewed:    Labs: Basic Metabolic Panel:  Recent Labs Lab 04/05/15 0103 04/05/15 0118 04/05/15 0459 04/05/15 0740 04/06/15 0500  NA 135 137 137 137 142  K 3.2* 3.1* 4.2 4.3 3.6  CL 102 98* 105 106 110  CO2 23  --   --  24 26  GLUCOSE 105* 103* 99 90 93  BUN 12 11 11 11 9   CREATININE 1.00 1.10* 0.70 0.66 0.62  CALCIUM 8.7*  --   --  7.2* 8.2*  MG 2.1  --   --  1.7  --    GFR Estimated Creatinine Clearance: 82.4 mL/min (by C-G formula based  on Cr of 0.62). Liver Function Tests:  Recent Labs Lab 04/05/15 0103 04/05/15 0740  AST 16 15  ALT 18 14  ALKPHOS 45 37*  BILITOT 0.3 0.4  PROT 7.1 5.6*  ALBUMIN 3.9 3.1*   Coagulation profile  Recent Labs Lab 04/05/15 0103  INR 1.01    CBC:  Recent Labs Lab 04/05/15 0103 04/05/15 0118 04/05/15 0459 04/05/15 0740  WBC 7.7  --   --  6.6  NEUTROABS 4.4  --   --  3.6  HGB 14.3 15.3* 12.9 12.5  HCT 42.5 45.0 38.0 37.1  MCV 93.6  --   --  93.0  PLT 317  --   --  314   CBG:  Recent Labs Lab 04/05/15 0245 04/05/15 0300 04/05/15 0738 04/05/15 1211 04/05/15 1654  GLUCAP 102* 101* 89 90 75   Sepsis Labs:  Recent Labs Lab 04/05/15 0103 04/05/15 0118 04/05/15 0308 04/05/15 0452 04/05/15 0730 04/05/15 0740  PROCALCITON  --   --   --  <0.10  --   --   WBC 7.7  --   --   --   --  6.6  LATICACIDVEN  --  3.54* 2.34*  --  1.2  --    Microbiology Recent Results (from the past 240 hour(s))  MRSA PCR Screening     Status: None   Collection Time: 04/05/15  5:45 AM  Result Value Ref Range Status   MRSA by PCR NEGATIVE NEGATIVE Final    Comment:        The GeneXpert MRSA Assay (FDA approved for NASAL specimens only), is one component of a comprehensive MRSA colonization surveillance program. It is not intended to diagnose MRSA infection nor to guide or monitor treatment for MRSA infections.      Medications:   . antiseptic oral rinse  7 mL Mouth Rinse q12n4p  . chlorhexidine  15 mL Mouth Rinse BID  . clonazePAM  0.5 mg Oral BID  . enoxaparin (LOVENOX) injection  40 mg Subcutaneous Q24H  . FLUoxetine  20 mg Oral Daily  . Influenza vac split quadrivalent PF  0.5 mL Intramuscular Tomorrow-1000  . thiamine  100 mg Oral Daily  . traZODone  50 mg Oral QHS   Continuous Infusions:    Time spent: 25 minutes.   LOS: 1 day   RAMA,CHRISTINA  [facility] Pager [phone]. If unable to reach me by pager, please call my cell phone at  [phone].  *Please refer to [email or web address], password [email or web address] to get updated schedule on who will round on this patient, as hospitalists switch teams weekly. If 7PM-7AM, please contact night-coverage at [email or web address], password [email or web address] for any overnight needs.  04/06/2015, 8:20 AM

## 2015-04-06 NOTE — Discharge Summary (Signed)
Physician Discharge Summary  Jamie Gilmore NFA:[id number] DOB: 08-Feb-1946 DOA: 04-06-15  PCP: Ronnie Doss  Admit date: 2015/04/06 Discharge date: 04/06/2015   Recommendations for Outpatient Follow-Up:   1. The patient is being discharged to [facility] psychiatric facility for further inpatient psychiatric treatment based on the recommendations of the consulting psychiatrist.   Discharge Diagnosis:   Principal Problem:   Intentional drug overdose of Ambien with toxic encephalopathy Active Problems:    Suicidal ideation    Hypertension    Hyperlipidemia    Hypothermia    Drug overdose    Depression    Prolonged Q-T interval on ECG    History of DVT (deep vein thrombosis)    Encephalopathy, toxic    Hypokalemia    Acute kidney injury    Lactic acidosis   Discharge disposition:  [facility].  Discharge Condition: Improved.  Diet recommendation: Low sodium, heart healthy.    History of Present Illness:   Jamie Gilmore is an 69 y.o. female with a PMH of hypertension, hyperlipidemia, prior DVT and depression who was admitted 04/06/15 after taking an intentional overdose of 20 tablets of Ambien 10 mg strength combined with alcohol after having an argument with her daughter. Patient was lethargic on initial presentation and mildly hypothermic.   Hospital Course by Problem:   Principal Problem:  Intentional drug overdose secondary to suicidal behavior/ideation / acute toxic encephalopathy - Poison control was contacted and recommended close monitoring with IV fluids. - Psychiatry consultation performed 04-06-2015 with recommendations for inpatient psychiatry. - Involuntarily committed 2015/04/06 after she attempted to leave AMA. - Medically stable for transfer to [facility].  Active Problems:  Lactic acidosis - Chest x-ray clear, no evidence of sepsis. - Lactate cleared with IV fluids.   Acute kidney  injury - Resolved with IV fluids.   Hypokalemia - Resolved with supplementation.   Depression - Started on Prozac 20 mg daily, clonazepam 0.5 mg twice a day and trazodone 50 mg daily at bedtime per psychiatry.   Prolonged QT interval - Avoid medications that prolong QT. Repeat 12 lead showed normalization of QTc and telemetry subsequently discontinued.   Hypothermia - Resolved.   Hypertension - Antihypertensives held on admission including HCTZ and Norvasc.   Hyperlipidemia - Has been treated with Lipitor in the past, but was not taking prior to admission. - Follow-up with PCP for further treatment recommendations.  Medical Consultants:   Psychiatry: Leata Mouse, MD   Discharge Exam:   Filed Vitals:   04/06/15 0615  BP: 131/69  Pulse: 60  Temp: 97.7 F (36.5 C)  Resp: 16   Filed Vitals:   06-Apr-2015 1159 04-06-15 1900 April 06, 2015 2131 04/06/15 0615  BP: 130/87 126/74 128/76 131/69  Pulse: 90 73 76 60  Temp: 98.1 F (36.7 C) 98.2 F (36.8 C) 98 F (36.7 C) 97.7 F (36.5 C)  TempSrc: Oral Oral Oral Oral  Resp: Height:      Weight:      SpO2: 96% 96% 98% 99%    Gen:  NAD Psych: Mood level  Cardiovascular:  RRR, No M/R/G Respiratory: Lungs CTAB Gastrointestinal: Abdomen soft, NT/ND with normal active bowel sounds. Extremities: No C/E/C   The results of significant diagnostics from this hospitalization (including imaging, microbiology, ancillary and laboratory) are listed below for reference.     Procedures and Diagnostic Studies:   Dg Chest Port 1 View  04-06-15   CLINICAL DATA:  Drug overdose.  Unresponsive.  EXAM: PORTABLE CHEST - 1 VIEW  COMPARISON:  02/11/2015  FINDINGS: There are low lung volumes with mild crowding of the basilar markings. No confluent airspace opacity. No large effusion. Hilar, mediastinal and cardiac contours are unremarkable.  IMPRESSION: No active disease.   Electronically Signed   By: Ellery Plunk M.D.   On: 04/05/2015 01:29     Labs:   Basic Metabolic Panel:  Recent Labs Lab 04/05/15 0103 04/05/15 0118 04/05/15 0459 04/05/15 0740 04/06/15 0500  NA 135 137 137 137 142  K 3.2* 3.1* 4.2 4.3 3.6  CL 102 98* 105 106 110  CO2 23  --   --  24 26  GLUCOSE 105* 103* 99 90 93  BUN 12 11 11 11 9   CREATININE 1.00 1.10* 0.70 0.66 0.62  CALCIUM 8.7*  --   --  7.2* 8.2*  MG 2.1  --   --  1.7  --    GFR Estimated Creatinine Clearance: 82.4 mL/min (by C-G formula based on Cr of 0.62). Liver Function Tests:  Recent Labs Lab 04/05/15 0103 04/05/15 0740  AST 16 15  ALT 18 14  ALKPHOS 45 37*  BILITOT 0.3 0.4  PROT 7.1 5.6*  ALBUMIN 3.9 3.1*   Coagulation profile  Recent Labs Lab 04/05/15 0103  INR 1.01    CBC:  Recent Labs Lab 04/05/15 0103 04/05/15 0118 04/05/15 0459 04/05/15 0740  WBC 7.7  --   --  6.6  NEUTROABS 4.4  --   --  3.6  HGB 14.3 15.3* 12.9 12.5  HCT 42.5 45.0 38.0 37.1  MCV 93.6  --   --  93.0  PLT 317  --   --  314   CBG:  Recent Labs Lab 04/05/15 0245 04/05/15 0300 04/05/15 0738 04/05/15 1211 04/05/15 1654  GLUCAP 102* 101* 89 90 75   Thyroid function studies  Recent Labs  04/05/15 0740  TSH 1.678   Microbiology Recent Results (from the past 240 hour(s))  MRSA PCR Screening     Status: None   Collection Time: 04/05/15  5:45 AM  Result Value Ref Range Status   MRSA by PCR NEGATIVE NEGATIVE Final    Comment:        The GeneXpert MRSA Assay (FDA approved for NASAL specimens only), is one component of a comprehensive MRSA colonization surveillance program. It is not intended to diagnose MRSA infection nor to guide or monitor treatment for MRSA infections.      Discharge Instructions:   Discharge Instructions    Call MD for:  extreme fatigue    Complete by:  As directed      Call MD for:  persistant dizziness or light-headedness    Complete by:  As directed      Diet - low sodium heart healthy     Complete by:  As directed      Increase activity slowly    Complete by:  As directed             Medication List    STOP taking these medications        ALPRAZolam 0.25 MG tablet  Commonly known as:  XANAX     amLODipine 10 MG tablet  Commonly known as:  NORVASC     atorvastatin 20 MG tablet  Commonly known as:  LIPITOR     bisacodyl 10 MG suppository  Commonly known as:  DULCOLAX     oxyCODONE-acetaminophen 5-325 MG per tablet  Commonly known  as:  PERCOCET/ROXICET     polyethylene glycol packet  Commonly known as:  MIRALAX / GLYCOLAX     zolpidem 10 MG tablet  Commonly known as:  AMBIEN      TAKE these medications        albuterol 108 (90 BASE) MCG/ACT inhaler  Commonly known as:  PROVENTIL HFA;VENTOLIN HFA  Inhale 2 puffs into the lungs every 6 (six) hours as needed. Wheezing and shortness of breath     clonazePAM 0.5 MG tablet  Commonly known as:  KLONOPIN  Take 1 tablet (0.5 mg total) by mouth 2 (two) times daily.     ergocalciferol 50000 UNITS capsule  Commonly known as:  VITAMIN D2  Take 50,000 Units by mouth once a week.     ferrous sulfate 325 (65 FE) MG tablet  Take 1 tablet (325 mg total) by mouth 3 (three) times daily after meals.     FLUoxetine 20 MG tablet  Commonly known as:  PROZAC  Take 20 mg by mouth daily before breakfast.     gabapentin 300 MG capsule  Commonly known as:  NEURONTIN  Take 300 mg by mouth at bedtime.     hydrochlorothiazide 25 MG tablet  Commonly known as:  HYDRODIURIL  Take 25 mg by mouth daily before breakfast.     methocarbamol 500 MG tablet  Commonly known as:  ROBAXIN  Take 1 tablet (500 mg total) by mouth at bedtime.     pantoprazole 20 MG tablet  Commonly known as:  PROTONIX  Take 20 mg by mouth daily.     Potassium Chloride ER 20 MEQ Tbcr  Take 40 mEq by mouth daily.     prednisoLONE acetate 1 % ophthalmic suspension  Commonly known as:  PRED FORTE  Place 1 drop into the left eye 4 (four) times  daily.     traMADol 50 MG tablet  Commonly known as:  ULTRAM  Take 50 mg by mouth every 6 (six) hours as needed for moderate pain.     traZODone 50 MG tablet  Commonly known as:  DESYREL  Take 1 tablet (50 mg total) by mouth at bedtime.           Follow-up Information    Schedule an appointment as soon as possible for a visit with WILLARD,JENNIFER, PA-C.   Specialty:  Family Medicine   Why:  1 week post hospitalization.   Contact information:   [address] [place] [address] [phone]        Time coordinating discharge: 35 minutes.   Signed:  RAMA,CHRISTINA  Pager [phone] [facility] 04/06/2015, 12:54 PM

## 2015-04-06 NOTE — Progress Notes (Addendum)
Clinical Social Work  Patient accepted to [facility] [phone]. RN to call report to [phone]. [facility] aware of IVC and CSW faxed IVC forms to hospital who reviewed information. Patient's DOB is incorrect on custody order but [facility] is aware and reports they will inform registration of correct date and court will correct date on IVC paperwork. Patient any S.O aware of DC plans. CSW arranged transportation via [place]. CSW is signing off but available if needed.  [place], [place] [phone]

## 2015-04-07 DIAGNOSIS — F332 Major depressive disorder, recurrent severe without psychotic features: Secondary | ICD-10-CM | POA: Diagnosis not present

## 2015-04-09 DIAGNOSIS — F4321 Adjustment disorder with depressed mood: Secondary | ICD-10-CM | POA: Diagnosis not present

## 2015-04-10 LAB — CULTURE, BLOOD (ROUTINE X 2): CULTURE: NO GROWTH

## 2015-04-15 DIAGNOSIS — F332 Major depressive disorder, recurrent severe without psychotic features: Secondary | ICD-10-CM | POA: Diagnosis not present

## 2015-04-15 DIAGNOSIS — Z23 Encounter for immunization: Secondary | ICD-10-CM | POA: Diagnosis not present

## 2015-04-15 DIAGNOSIS — G4700 Insomnia, unspecified: Secondary | ICD-10-CM | POA: Diagnosis not present

## 2015-04-29 DIAGNOSIS — H2512 Age-related nuclear cataract, left eye: Secondary | ICD-10-CM | POA: Diagnosis not present

## 2015-04-29 DIAGNOSIS — H25012 Cortical age-related cataract, left eye: Secondary | ICD-10-CM | POA: Diagnosis not present

## 2015-04-29 DIAGNOSIS — H25812 Combined forms of age-related cataract, left eye: Secondary | ICD-10-CM | POA: Diagnosis not present

## 2015-05-20 DIAGNOSIS — E785 Hyperlipidemia, unspecified: Secondary | ICD-10-CM | POA: Diagnosis not present

## 2015-05-20 DIAGNOSIS — M179 Osteoarthritis of knee, unspecified: Secondary | ICD-10-CM | POA: Diagnosis not present

## 2015-05-20 DIAGNOSIS — F324 Major depressive disorder, single episode, in partial remission: Secondary | ICD-10-CM | POA: Diagnosis not present

## 2015-05-20 DIAGNOSIS — G894 Chronic pain syndrome: Secondary | ICD-10-CM | POA: Diagnosis not present

## 2015-05-20 DIAGNOSIS — R7301 Impaired fasting glucose: Secondary | ICD-10-CM | POA: Diagnosis not present

## 2015-05-20 DIAGNOSIS — F419 Anxiety disorder, unspecified: Secondary | ICD-10-CM | POA: Diagnosis not present

## 2015-05-20 DIAGNOSIS — I10 Essential (primary) hypertension: Secondary | ICD-10-CM | POA: Diagnosis not present

## 2015-05-20 DIAGNOSIS — G4700 Insomnia, unspecified: Secondary | ICD-10-CM | POA: Diagnosis not present

## 2015-05-20 DIAGNOSIS — E876 Hypokalemia: Secondary | ICD-10-CM | POA: Diagnosis not present

## 2015-08-13 DIAGNOSIS — R509 Fever, unspecified: Secondary | ICD-10-CM | POA: Diagnosis not present

## 2015-08-13 DIAGNOSIS — R350 Frequency of micturition: Secondary | ICD-10-CM | POA: Diagnosis not present

## 2015-08-13 DIAGNOSIS — J019 Acute sinusitis, unspecified: Secondary | ICD-10-CM | POA: Diagnosis not present

## 2015-10-26 DIAGNOSIS — F324 Major depressive disorder, single episode, in partial remission: Secondary | ICD-10-CM | POA: Diagnosis not present

## 2015-10-26 DIAGNOSIS — G479 Sleep disorder, unspecified: Secondary | ICD-10-CM | POA: Diagnosis not present

## 2015-10-26 DIAGNOSIS — E876 Hypokalemia: Secondary | ICD-10-CM | POA: Diagnosis not present

## 2015-10-26 DIAGNOSIS — E559 Vitamin D deficiency, unspecified: Secondary | ICD-10-CM | POA: Diagnosis not present

## 2015-10-26 DIAGNOSIS — E785 Hyperlipidemia, unspecified: Secondary | ICD-10-CM | POA: Diagnosis not present

## 2015-10-26 DIAGNOSIS — K219 Gastro-esophageal reflux disease without esophagitis: Secondary | ICD-10-CM | POA: Diagnosis not present

## 2015-10-26 DIAGNOSIS — F419 Anxiety disorder, unspecified: Secondary | ICD-10-CM | POA: Diagnosis not present

## 2015-10-26 DIAGNOSIS — F17200 Nicotine dependence, unspecified, uncomplicated: Secondary | ICD-10-CM | POA: Diagnosis not present

## 2015-10-26 DIAGNOSIS — I10 Essential (primary) hypertension: Secondary | ICD-10-CM | POA: Diagnosis not present

## 2015-10-26 DIAGNOSIS — M179 Osteoarthritis of knee, unspecified: Secondary | ICD-10-CM | POA: Diagnosis not present

## 2016-02-27 IMAGING — DX DG CHEST 1V PORT
1 series · 1 of 1 positions shown · non-contrast
Comparison: 02/11/2015

CLINICAL DATA: Drug overdose.  Unresponsive.

EXAM:
PORTABLE CHEST - 1 VIEW

[chest ap]
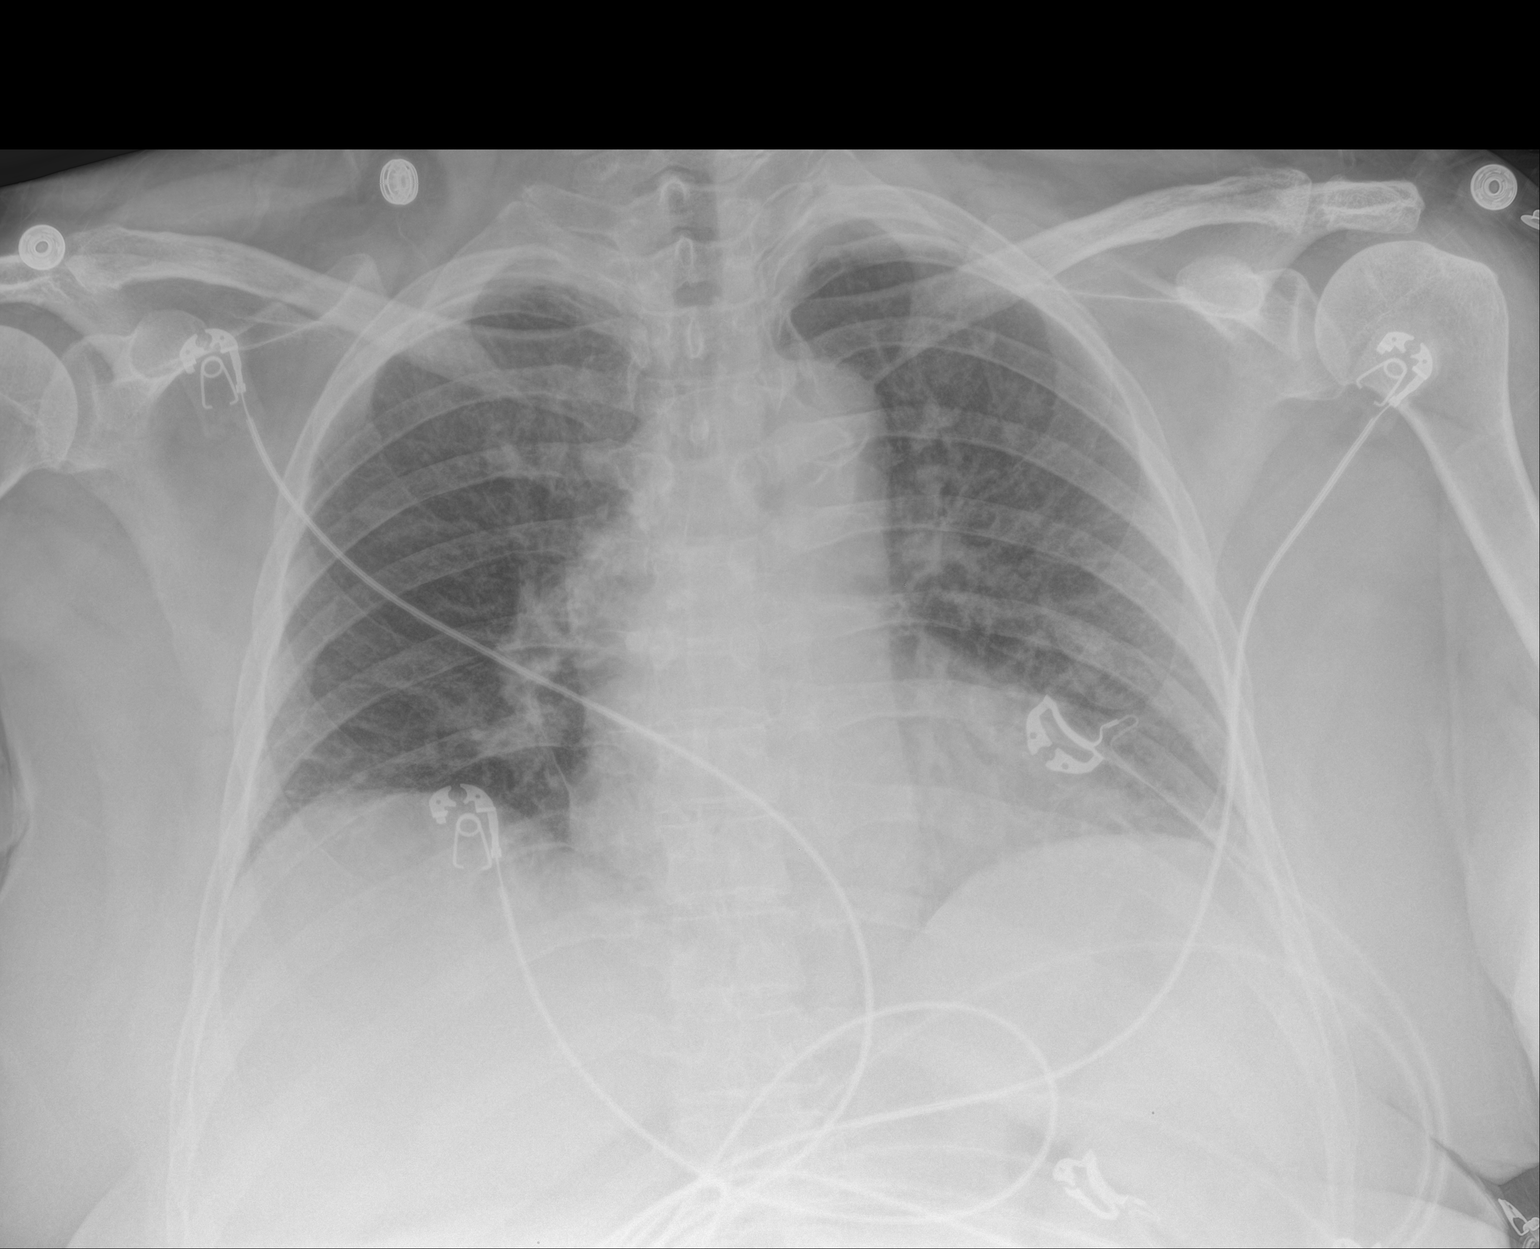

[1 of 1 positions shown; findings below may reference images not displayed]

FINDINGS: There are low lung volumes with mild crowding of the basilar
markings. No confluent airspace opacity. No large effusion. Hilar,
mediastinal and cardiac contours are unremarkable.
IMPRESSION: No active disease.

## 2016-06-14 DIAGNOSIS — K219 Gastro-esophageal reflux disease without esophagitis: Secondary | ICD-10-CM | POA: Diagnosis not present

## 2016-06-14 DIAGNOSIS — E559 Vitamin D deficiency, unspecified: Secondary | ICD-10-CM | POA: Diagnosis not present

## 2016-06-14 DIAGNOSIS — I10 Essential (primary) hypertension: Secondary | ICD-10-CM | POA: Diagnosis not present

## 2016-06-14 DIAGNOSIS — G894 Chronic pain syndrome: Secondary | ICD-10-CM | POA: Diagnosis not present

## 2016-06-14 DIAGNOSIS — E785 Hyperlipidemia, unspecified: Secondary | ICD-10-CM | POA: Diagnosis not present

## 2017-01-03 DIAGNOSIS — I10 Essential (primary) hypertension: Secondary | ICD-10-CM | POA: Diagnosis not present

## 2017-01-03 DIAGNOSIS — F324 Major depressive disorder, single episode, in partial remission: Secondary | ICD-10-CM | POA: Diagnosis not present

## 2017-01-03 DIAGNOSIS — F17200 Nicotine dependence, unspecified, uncomplicated: Secondary | ICD-10-CM | POA: Diagnosis not present

## 2017-01-03 DIAGNOSIS — E785 Hyperlipidemia, unspecified: Secondary | ICD-10-CM | POA: Diagnosis not present

## 2017-01-03 DIAGNOSIS — Q619 Cystic kidney disease, unspecified: Secondary | ICD-10-CM | POA: Diagnosis not present

## 2017-01-03 DIAGNOSIS — E559 Vitamin D deficiency, unspecified: Secondary | ICD-10-CM | POA: Diagnosis not present

## 2017-01-03 DIAGNOSIS — G894 Chronic pain syndrome: Secondary | ICD-10-CM | POA: Diagnosis not present

## 2017-01-03 DIAGNOSIS — Z6841 Body Mass Index (BMI) 40.0 and over, adult: Secondary | ICD-10-CM | POA: Diagnosis not present

## 2017-10-02 DIAGNOSIS — R739 Hyperglycemia, unspecified: Secondary | ICD-10-CM | POA: Diagnosis not present

## 2017-10-02 DIAGNOSIS — E785 Hyperlipidemia, unspecified: Secondary | ICD-10-CM | POA: Diagnosis not present

## 2017-10-02 DIAGNOSIS — G894 Chronic pain syndrome: Secondary | ICD-10-CM | POA: Diagnosis not present

## 2017-10-02 DIAGNOSIS — E559 Vitamin D deficiency, unspecified: Secondary | ICD-10-CM | POA: Diagnosis not present

## 2017-10-02 DIAGNOSIS — I10 Essential (primary) hypertension: Secondary | ICD-10-CM | POA: Diagnosis not present

## 2017-10-02 DIAGNOSIS — Z6837 Body mass index (BMI) 37.0-37.9, adult: Secondary | ICD-10-CM | POA: Diagnosis not present

## 2018-08-12 DIAGNOSIS — R69 Illness, unspecified: Secondary | ICD-10-CM | POA: Diagnosis not present

## 2018-08-13 DIAGNOSIS — R69 Illness, unspecified: Secondary | ICD-10-CM | POA: Diagnosis not present

## 2018-08-20 DIAGNOSIS — Z713 Dietary counseling and surveillance: Secondary | ICD-10-CM | POA: Diagnosis not present

## 2018-08-20 DIAGNOSIS — Z6837 Body mass index (BMI) 37.0-37.9, adult: Secondary | ICD-10-CM | POA: Diagnosis not present

## 2018-08-20 DIAGNOSIS — R69 Illness, unspecified: Secondary | ICD-10-CM | POA: Diagnosis not present

## 2018-09-24 DIAGNOSIS — Z6837 Body mass index (BMI) 37.0-37.9, adult: Secondary | ICD-10-CM | POA: Diagnosis not present

## 2018-09-24 DIAGNOSIS — Z713 Dietary counseling and surveillance: Secondary | ICD-10-CM | POA: Diagnosis not present

## 2018-10-10 DIAGNOSIS — R69 Illness, unspecified: Secondary | ICD-10-CM | POA: Diagnosis not present

## 2019-03-11 DIAGNOSIS — R69 Illness, unspecified: Secondary | ICD-10-CM | POA: Diagnosis not present

## 2019-03-11 DIAGNOSIS — Z6841 Body Mass Index (BMI) 40.0 and over, adult: Secondary | ICD-10-CM | POA: Diagnosis not present

## 2019-03-11 DIAGNOSIS — Z713 Dietary counseling and surveillance: Secondary | ICD-10-CM | POA: Diagnosis not present

## 2019-04-29 DIAGNOSIS — Z6837 Body mass index (BMI) 37.0-37.9, adult: Secondary | ICD-10-CM | POA: Diagnosis not present

## 2019-04-29 DIAGNOSIS — Z713 Dietary counseling and surveillance: Secondary | ICD-10-CM | POA: Diagnosis not present

## 2019-07-02 DIAGNOSIS — M25511 Pain in right shoulder: Secondary | ICD-10-CM | POA: Diagnosis not present

## 2019-07-02 DIAGNOSIS — M25551 Pain in right hip: Secondary | ICD-10-CM | POA: Diagnosis not present

## 2019-07-10 DIAGNOSIS — H04123 Dry eye syndrome of bilateral lacrimal glands: Secondary | ICD-10-CM | POA: Diagnosis not present

## 2019-07-10 DIAGNOSIS — H2511 Age-related nuclear cataract, right eye: Secondary | ICD-10-CM | POA: Diagnosis not present

## 2019-07-10 DIAGNOSIS — H5213 Myopia, bilateral: Secondary | ICD-10-CM | POA: Diagnosis not present

## 2019-07-10 DIAGNOSIS — H52222 Regular astigmatism, left eye: Secondary | ICD-10-CM | POA: Diagnosis not present

## 2019-07-10 DIAGNOSIS — H35033 Hypertensive retinopathy, bilateral: Secondary | ICD-10-CM | POA: Diagnosis not present

## 2019-07-10 DIAGNOSIS — H524 Presbyopia: Secondary | ICD-10-CM | POA: Diagnosis not present

## 2019-07-10 DIAGNOSIS — H26492 Other secondary cataract, left eye: Secondary | ICD-10-CM | POA: Diagnosis not present

## 2019-07-12 DIAGNOSIS — H52222 Regular astigmatism, left eye: Secondary | ICD-10-CM | POA: Diagnosis not present

## 2019-07-12 DIAGNOSIS — H524 Presbyopia: Secondary | ICD-10-CM | POA: Diagnosis not present

## 2019-08-20 DIAGNOSIS — M25511 Pain in right shoulder: Secondary | ICD-10-CM | POA: Diagnosis not present

## 2019-08-20 DIAGNOSIS — M25551 Pain in right hip: Secondary | ICD-10-CM | POA: Diagnosis not present

## 2019-08-20 DIAGNOSIS — M25512 Pain in left shoulder: Secondary | ICD-10-CM | POA: Diagnosis not present

## 2019-10-16 DIAGNOSIS — M25511 Pain in right shoulder: Secondary | ICD-10-CM | POA: Diagnosis not present

## 2019-10-16 DIAGNOSIS — M25551 Pain in right hip: Secondary | ICD-10-CM | POA: Diagnosis not present

## 2019-10-16 DIAGNOSIS — M25512 Pain in left shoulder: Secondary | ICD-10-CM | POA: Diagnosis not present

## 2019-11-21 DIAGNOSIS — R69 Illness, unspecified: Secondary | ICD-10-CM | POA: Diagnosis not present

## 2020-03-16 DIAGNOSIS — J069 Acute upper respiratory infection, unspecified: Secondary | ICD-10-CM | POA: Diagnosis not present

## 2020-03-16 DIAGNOSIS — Z20822 Contact with and (suspected) exposure to covid-19: Secondary | ICD-10-CM | POA: Diagnosis not present

## 2020-03-18 DIAGNOSIS — U071 COVID-19: Secondary | ICD-10-CM | POA: Diagnosis not present

## 2020-05-24 DIAGNOSIS — M25511 Pain in right shoulder: Secondary | ICD-10-CM | POA: Diagnosis not present

## 2020-05-24 DIAGNOSIS — M79641 Pain in right hand: Secondary | ICD-10-CM | POA: Diagnosis not present

## 2020-05-24 DIAGNOSIS — M79642 Pain in left hand: Secondary | ICD-10-CM | POA: Diagnosis not present

## 2020-05-24 DIAGNOSIS — M25512 Pain in left shoulder: Secondary | ICD-10-CM | POA: Diagnosis not present

## 2020-05-24 DIAGNOSIS — M542 Cervicalgia: Secondary | ICD-10-CM | POA: Diagnosis not present

## 2020-06-09 DIAGNOSIS — G5603 Carpal tunnel syndrome, bilateral upper limbs: Secondary | ICD-10-CM | POA: Diagnosis not present

## 2020-06-16 DIAGNOSIS — G5603 Carpal tunnel syndrome, bilateral upper limbs: Secondary | ICD-10-CM | POA: Diagnosis not present

## 2020-06-30 DIAGNOSIS — R5382 Chronic fatigue, unspecified: Secondary | ICD-10-CM | POA: Diagnosis not present

## 2020-06-30 DIAGNOSIS — E538 Deficiency of other specified B group vitamins: Secondary | ICD-10-CM | POA: Diagnosis not present

## 2020-06-30 DIAGNOSIS — E611 Iron deficiency: Secondary | ICD-10-CM | POA: Diagnosis not present

## 2020-06-30 DIAGNOSIS — Z1211 Encounter for screening for malignant neoplasm of colon: Secondary | ICD-10-CM | POA: Diagnosis not present

## 2020-06-30 DIAGNOSIS — Z0000 Encounter for general adult medical examination without abnormal findings: Secondary | ICD-10-CM | POA: Diagnosis not present

## 2020-06-30 DIAGNOSIS — E559 Vitamin D deficiency, unspecified: Secondary | ICD-10-CM | POA: Diagnosis not present

## 2020-06-30 DIAGNOSIS — E785 Hyperlipidemia, unspecified: Secondary | ICD-10-CM | POA: Diagnosis not present

## 2020-06-30 DIAGNOSIS — I10 Essential (primary) hypertension: Secondary | ICD-10-CM | POA: Diagnosis not present

## 2020-06-30 DIAGNOSIS — R69 Illness, unspecified: Secondary | ICD-10-CM | POA: Diagnosis not present

## 2020-07-07 DIAGNOSIS — G5602 Carpal tunnel syndrome, left upper limb: Secondary | ICD-10-CM | POA: Diagnosis not present

## 2020-07-07 DIAGNOSIS — G5603 Carpal tunnel syndrome, bilateral upper limbs: Secondary | ICD-10-CM | POA: Diagnosis not present

## 2020-07-07 DIAGNOSIS — G5601 Carpal tunnel syndrome, right upper limb: Secondary | ICD-10-CM | POA: Diagnosis not present

## 2020-07-15 NOTE — Progress Notes (Signed)
Cardiology Office Note:    Date:  07/16/2020   ID:  Jamie Gilmore, DOB May 15, 1946, MRN [id number]  PCP:  Carlos Levering, PA-C  Cardiologist:  No primary care provider on file.  Electrophysiologist:  None   Referring MD: Maurice Small, MD   Chief Complaint  Patient presents with  . Chest Pain    History of Present Illness:    Jamie Gilmore is a 74 y.o. female with a hx of hypertension, DVT following knee replacement, tobacco use, hyperlipidemia who is referred by Dr. Justin Mend for evaluation of chest pain and dyspnea following COVID-19 infection.  She reports that she has had shortness of breath for years.  Had COVID-19 infection in August 2021.  Reports that dyspnea has worsened since that time.  Reports shortness of breath with minimal exertion.  Also reports she has been having chest pain with deep breathing.  Occurs about once per week, described as sharp pain.  Reports lightheadedness with exertion, denies any syncope.  States that she is lightheaded with standing.  Does report some lower extremity edema at night.  Denies any palpitations.  Recently quit smoking.  Family history includes father had CHF, died at age 42.  Past Medical History:  Diagnosis Date  . Acute blood loss anemia 08/01/2012  . Arthritis   . Depression   . Elevated blood sugar    "borderline diabetes"  . GERD (gastroesophageal reflux disease)   . Headache(784.0)    history migraines  . Hypertension   . Peripheral vascular disease ([place]) 05/2010   DVT post op knee replacement  . Shortness of breath    "all my life"- rarely use    Past Surgical History:  Procedure Laterality Date  . CARPAL TUNNEL RELEASE     left  . JOINT REPLACEMENT  05/2010   left knee  . RECTOVAGINAL FISTULA CLOSURE    . TONSILLECTOMY    . TOTAL HIP ARTHROPLASTY  07/29/2012   Procedure: TOTAL HIP ARTHROPLASTY;  Surgeon: Tobi Bastos, MD;  Location: [facility];  Service: Orthopedics;  Laterality: Left;  . TOTAL KNEE  ARTHROPLASTY      Current Medications: Current Meds  Medication Sig  . albuterol (PROVENTIL HFA;VENTOLIN HFA) 108 (90 BASE) MCG/ACT inhaler Inhale 2 puffs into the lungs every 6 (six) hours as needed. Wheezing and shortness of breath  . ALPRAZolam (XANAX) 0.25 MG tablet alprazolam 0.25 mg tablet  . atorvastatin (LIPITOR) 20 MG tablet Take 20 mg by mouth daily.  Marland Kitchen FLUoxetine (PROZAC) 20 MG tablet Take 20 mg by mouth daily before breakfast.  . FLUoxetine (PROZAC) 40 MG capsule fluoxetine 40 mg capsule  . gabapentin (NEURONTIN) 300 MG capsule Take 300 mg by mouth at bedtime.  . hydrochlorothiazide (HYDRODIURIL) 25 MG tablet Take 25 mg by mouth daily before breakfast.   . HYDROcodone-acetaminophen (NORCO/VICODIN) 5-325 MG tablet hydrocodone 5 mg-acetaminophen 325 mg tablet  . indomethacin (INDOCIN) 25 MG capsule Take 25 mg by mouth 2 (two) times daily.  . pantoprazole (PROTONIX) 40 MG tablet pantoprazole 40 mg tablet,delayed release  . traMADol (ULTRAM) 50 MG tablet Take 50 mg by mouth every 6 (six) hours as needed for moderate pain.  . traZODone (DESYREL) 50 MG tablet Take 1 tablet (50 mg total) by mouth at bedtime.  . [DISCONTINUED] aspirin 325 MG tablet aspirin 325 mg tablet  Take 1 tablet every day by oral route.     Allergies:   Lisinopril   Social History   Socioeconomic History  . Marital status:  Divorced    Spouse name: Not on file  . Number of children: Not on file  . Years of education: Not on file  . Highest education level: Not on file  Occupational History  . Not on file  Tobacco Use  . Smoking status: Current Every Day Smoker    Packs/day: 2.00    Types: Cigarettes    Last attempt to quit: 10/22/2011    Years since quitting: 8.7  . Smokeless tobacco: Never Used  Substance and Sexual Activity  . Alcohol use: Yes    Comment: 1-2 glasses white wine  . Drug use: No  . Sexual activity: Not on file  Other Topics Concern  . Not on file  Social History Narrative  .  Not on file   Social Determinants of Health   Financial Resource Strain: Not on file  Food Insecurity: Not on file  Transportation Needs: Not on file  Physical Activity: Not on file  Stress: Not on file  Social Connections: Not on file     Family History: The patient's family history includes Stroke in her mother.  ROS:   Please see the history of present illness.     All other systems reviewed and are negative.  EKGs/Labs/Other Studies Reviewed:    The following studies were reviewed today:   EKG:  EKG is ordered today.  The ekg ordered today demonstrates normal sinus rhythm, rate 71, right axis deviation, no ST abnormalities  Recent Labs: No results found for requested labs within last 8760 hours.  Recent Lipid Panel No results found for: CHOL, TRIG, HDL, CHOLHDL, VLDL, LDLCALC, LDLDIRECT  Physical Exam:    VS:  BP 125/79 (BP Location: Right Arm, Cuff Size: Large)   Pulse 64   Ht _0  (1.651 m)   Wt 231 lb (104.8 kg)   SpO2 96%   BMI 38.44 kg/m     Wt Readings from Last 3 Encounters:  07/16/20 231 lb (104.8 kg)  04/05/15 233 lb (105.7 kg)  02/11/15 224 lb 6 oz (101.8 kg)     GEN: Well nourished, well developed in no acute distress HEENT: Normal NECK: No JVD; No carotid bruits LYMPHATICS: No lymphadenopathy CARDIAC: RRR, no murmurs, rubs, gallops RESPIRATORY:  Clear to auscultation without rales, wheezing or rhonchi  ABDOMEN: Soft, non-tender, non-distended MUSCULOSKELETAL:  No edema; No deformity  SKIN: Warm and dry NEUROLOGIC:  Alert and oriented x 3 PSYCHIATRIC:  Normal affect   ASSESSMENT:    1. Chest pain of uncertain etiology   2. Shortness of breath   3. Lightheaded   4. Essential hypertension   5. Hyperlipidemia, unspecified hyperlipidemia type    PLAN:    DOE: Given worsening symptoms since COVID-19 infection in August, will check echocardiogram to rule out myocardial involvement.  Also has significant CAD risk factors (hypertension,  hyperlipidemia, tobacco use, age).  Will check Lexiscan Myoview to evaluate for ischemia.  Chest pain: Atypical in description, describes pleuritic pain that lasts for few seconds.  No EKG changes to suggest pericarditis.  Will check ESR/CRP.  Lightheadedness: Orthostatics in clinic today were unremarkable.  Hypertension: On hydrochlorothiazide 25 mg daily.  Appears controlled.  Will check BMP  Hyperlipidemia: On atorvastatin 20 mg daily  RTC in 3 months   Shared Decision Making/Informed Consent The risks [chest pain, shortness of breath, cardiac arrhythmias, dizziness, blood pressure fluctuations, myocardial infarction, stroke/transient ischemic attack, nausea, vomiting, allergic reaction, radiation exposure, metallic taste sensation and life-threatening complications (estimated to be 1 in 10,000)],  benefits (risk stratification, diagnosing coronary artery disease, treatment guidance) and alternatives of a nuclear stress test were discussed in detail with Ms. Taussig and she agrees to proceed.     Medication Adjustments/Labs and Tests Ordered: Current medicines are reviewed at length with the patient today.  Concerns regarding medicines are outlined above.  Orders Placed This Encounter  Procedures  . Basic metabolic panel  . C-reactive protein  . Sedimentation rate  . MYOCARDIAL PERFUSION IMAGING  . EKG 12-Lead  . ECHOCARDIOGRAM COMPLETE   Meds ordered this encounter  Medications  . aspirin EC 81 MG tablet    Sig: Take 1 tablet (81 mg total) by mouth daily. Swallow whole.    Dispense:  90 tablet    Refill:  3    Patient Instructions  Medication Instructions:  DECREASE aspirin to 81 mg daily  *If you need a refill on your cardiac medications before your next appointment, please call your pharmacy*   Lab Work: BMET, ESR, CRP today  If you have labs (blood work) drawn today and your tests are completely normal, you will receive your results only by: Marland Kitchen MyChart Message (if  you have MyChart) OR . A paper copy in the mail If you have any lab test that is abnormal or we need to change your treatment, we will call you to review the results.   Testing/Procedures: Your physician has requested that you have a lexiscan myoview at [facility]. For further information please visit [email or web address]. Please follow instruction sheet, as given.   How to prepare for your Myocardial Perfusion Test:  Do not eat or drink 3 hours prior to your test, except you may have water.  Do not consume products containing caffeine (regular or decaffeinated) 12 hours prior to your test. (ex: coffee, chocolate, sodas, tea).  Do bring a list of your current medications with you.  If not listed below, you may take your medications as normal.  Do wear comfortable clothes (no dresses or overalls) and walking shoes, tennis shoes preferred (No heels or open toe shoes are allowed).  Do NOT wear cologne, perfume, aftershave, or lotions (deodorant is allowed).  The test will take approximately 3 to 4 hours to complete  If these instructions are not followed, your test will have to be rescheduled.   Your physician has requested that you have an echocardiogram. Echocardiography is a painless test that uses sound waves to create images of your heart. It provides your doctor with information about the size and shape of your heart and how well your heart's chambers and valves are working. This procedure takes approximately one hour. There are no restrictions for this procedure. This will be done at our [facility] location:  [place]: At [facility], you and your health needs are our priority.  As part of our continuing mission to provide you with exceptional heart care, we have created designated Provider Care Teams.  These Care Teams include your primary Cardiologist (physician) and Advanced Practice Providers (APPs -  Physician Assistants and Nurse  Practitioners) who all work together to provide you with the care you need, when you need it.  We recommend signing up for the patient portal called "MyChart".  Sign up information is provided on this After Visit Summary.  MyChart is used to connect with patients for Virtual Visits (Telemedicine).  Patients are able to view lab/test results, encounter notes, upcoming appointments, etc.  Non-urgent messages can be sent to your provider  as well.   To learn more about what you can do with MyChart, go to [email or web address].    Your next appointment:   3 month(s)  The format for your next appointment:   In Person  Provider:   Oswaldo Milian, MD         Signed, Donato Heinz, MD  07/16/2020 11:43 AM    [place]

## 2020-07-16 ENCOUNTER — Other Ambulatory Visit: Payer: Self-pay

## 2020-07-16 ENCOUNTER — Ambulatory Visit (INDEPENDENT_AMBULATORY_CARE_PROVIDER_SITE_OTHER): Payer: Medicare HMO | Admitting: Cardiology

## 2020-07-16 ENCOUNTER — Encounter: Payer: Self-pay | Admitting: Cardiology

## 2020-07-16 VITALS — BP 125/79 | HR 64 | Ht 65.0 in | Wt 231.0 lb

## 2020-07-16 DIAGNOSIS — R42 Dizziness and giddiness: Secondary | ICD-10-CM | POA: Diagnosis not present

## 2020-07-16 DIAGNOSIS — E785 Hyperlipidemia, unspecified: Secondary | ICD-10-CM | POA: Diagnosis not present

## 2020-07-16 DIAGNOSIS — I10 Essential (primary) hypertension: Secondary | ICD-10-CM

## 2020-07-16 DIAGNOSIS — R0602 Shortness of breath: Secondary | ICD-10-CM

## 2020-07-16 DIAGNOSIS — R079 Chest pain, unspecified: Secondary | ICD-10-CM | POA: Diagnosis not present

## 2020-07-16 MED ORDER — ASPIRIN EC 81 MG PO TBEC: 81.0000 mg | DELAYED_RELEASE_TABLET | Freq: Every day | ORAL | 3 refills | Status: DC

## 2020-07-16 NOTE — Patient Instructions (Addendum)
Medication Instructions:  DECREASE aspirin to 81 mg daily  *If you need a refill on your cardiac medications before your next appointment, please call your pharmacy*   Lab Work: BMET, ESR, CRP today  If you have labs (blood work) drawn today and your tests are completely normal, you will receive your results only by: Marland Kitchen MyChart Message (if you have MyChart) OR . A paper copy in the mail If you have any lab test that is abnormal or we need to change your treatment, we will call you to review the results.   Testing/Procedures: Your physician has requested that you have a lexiscan myoview at [facility]. For further information please visit [email or web address]. Please follow instruction sheet, as given.   How to prepare for your Myocardial Perfusion Test:  Do not eat or drink 3 hours prior to your test, except you may have water.  Do not consume products containing caffeine (regular or decaffeinated) 12 hours prior to your test. (ex: coffee, chocolate, sodas, tea).  Do bring a list of your current medications with you.  If not listed below, you may take your medications as normal.  Do wear comfortable clothes (no dresses or overalls) and walking shoes, tennis shoes preferred (No heels or open toe shoes are allowed).  Do NOT wear cologne, perfume, aftershave, or lotions (deodorant is allowed).  The test will take approximately 3 to 4 hours to complete  If these instructions are not followed, your test will have to be rescheduled.   Your physician has requested that you have an echocardiogram. Echocardiography is a painless test that uses sound waves to create images of your heart. It provides your doctor with information about the size and shape of your heart and how well your heart's chambers and valves are working. This procedure takes approximately one hour. There are no restrictions for this procedure. This will be done at our [facility] location:  [place]: At [facility], you and your health needs are our priority.  As part of our continuing mission to provide you with exceptional heart care, we have created designated Provider Care Teams.  These Care Teams include your primary Cardiologist (physician) and Advanced Practice Providers (APPs -  Physician Assistants and Nurse Practitioners) who all work together to provide you with the care you need, when you need it.  We recommend signing up for the patient portal called "MyChart".  Sign up information is provided on this After Visit Summary.  MyChart is used to connect with patients for Virtual Visits (Telemedicine).  Patients are able to view lab/test results, encounter notes, upcoming appointments, etc.  Non-urgent messages can be sent to your provider as well.   To learn more about what you can do with MyChart, go to [email or web address].    Your next appointment:   3 month(s)  The format for your next appointment:   In Person  Provider:   Oswaldo Milian, MD

## 2020-07-17 LAB — BASIC METABOLIC PANEL
BUN/Creatinine Ratio: 17 (ref 12–28)
BUN: 16 mg/dL (ref 8–27)
CO2: 28 mmol/L (ref 20–29)
Calcium: 9 mg/dL (ref 8.7–10.3)
Chloride: 100 mmol/L (ref 96–106)
Creatinine, Ser: 0.93 mg/dL (ref 0.57–1.00)
GFR calc Af Amer: 71 mL/min/{1.73_m2} (ref >59)
GFR calc non Af Amer: 62 mL/min/{1.73_m2} (ref >59)
Glucose: 96 mg/dL (ref 65–99)
Potassium: 4.8 mmol/L (ref 3.5–5.2)
Sodium: 140 mmol/L (ref 134–144)

## 2020-07-17 LAB — SEDIMENTATION RATE: Sed Rate: 5 mm/hr (ref 0–40)

## 2020-07-17 LAB — C-REACTIVE PROTEIN: CRP: 12 mg/L — ABNORMAL HIGH (ref 0–10)

## 2020-08-11 ENCOUNTER — Telehealth (HOSPITAL_COMMUNITY): Payer: Self-pay | Admitting: *Deleted

## 2020-08-11 NOTE — Telephone Encounter (Signed)
Left message on voicemail in reference to upcoming appointment scheduled for 08/16/20. Phone number given for a call back so details instructions can be given. Daneil Dolin

## 2020-08-11 NOTE — Telephone Encounter (Signed)
Patient given detailed instructions per Myocardial Perfusion Study Information Sheet for the test on 08/16/20 at 8:00. Patient notified to arrive 15 minutes early and that it is imperative to arrive on time for appointment to keep from having the test rescheduled.  If you need to cancel or reschedule your appointment, please call the office within 24 hours of your appointment. . Patient verbalized understanding.Jamie Gilmore

## 2020-08-12 ENCOUNTER — Other Ambulatory Visit (HOSPITAL_COMMUNITY): Payer: Medicare HMO

## 2020-08-12 ENCOUNTER — Encounter (HOSPITAL_COMMUNITY): Payer: Self-pay | Admitting: Cardiology

## 2020-08-16 ENCOUNTER — Encounter (HOSPITAL_COMMUNITY): Payer: Medicare HMO

## 2020-09-01 ENCOUNTER — Telehealth (HOSPITAL_COMMUNITY): Payer: Self-pay | Admitting: Cardiology

## 2020-09-01 NOTE — Telephone Encounter (Signed)
Patient called and cancelled echocardiogram and Myoview and doesn't wish to reschedule due to she doesn't want the bills. Orders will be removed from the WQ.

## 2020-09-02 ENCOUNTER — Encounter (HOSPITAL_COMMUNITY): Payer: Medicare HMO

## 2020-09-15 ENCOUNTER — Other Ambulatory Visit (HOSPITAL_COMMUNITY): Payer: Medicare HMO

## 2020-10-15 ENCOUNTER — Ambulatory Visit: Payer: Medicare HMO | Admitting: Cardiology

## 2020-10-15 NOTE — Progress Notes (Deleted)
Cardiology Office Note:    Date:  10/15/2020   ID:  Jamie Gilmore, DOB Sep 28, 1945, MRN [id number]  PCP:  Carlos Levering, PA-C  Cardiologist:  No primary care provider on file.  Electrophysiologist:  None   Referring MD: Carlos Levering, PA-C   No chief complaint on file.   History of Present Illness:    Jamie Gilmore is a 75 y.o. female with a hx of hypertension, DVT following knee replacement, tobacco use, hyperlipidemia who presents for follow-up.  She was referred by Dr. Justin Mend for evaluation of chest pain and dyspnea following COVID-19 infection, initially seen on 07/16/2020.  She reports that she has had shortness of breath for years.  Had COVID-19 infection in August 2021.  Reports that dyspnea has worsened since that time.  Reports shortness of breath with minimal exertion.  Also reports she has been having chest pain with deep breathing.  Occurs about once per week, described as sharp pain.  Reports lightheadedness with exertion, denies any syncope.  States that she is lightheaded with standing.  Does report some lower extremity edema at night.  Denies any palpitations.  Recently quit smoking.  Family history includes father had CHF, died at age 79.  At initial clinic visit, echocardiogram and Lexiscan Myoview were ordered.  These have not been done yet.  Past Medical History:  Diagnosis Date  . Acute blood loss anemia 08/01/2012  . Arthritis   . Depression   . Elevated blood sugar    "borderline diabetes"  . GERD (gastroesophageal reflux disease)   . Headache(784.0)    history migraines  . Hypertension   . Peripheral vascular disease ([place]) 05/2010   DVT post op knee replacement  . Shortness of breath    "all my life"- rarely use    Past Surgical History:  Procedure Laterality Date  . CARPAL TUNNEL RELEASE     left  . JOINT REPLACEMENT  05/2010   left knee  . RECTOVAGINAL FISTULA CLOSURE    . TONSILLECTOMY    . TOTAL HIP ARTHROPLASTY  07/29/2012    Procedure: TOTAL HIP ARTHROPLASTY;  Surgeon: Tobi Bastos, MD;  Location: [facility];  Service: Orthopedics;  Laterality: Left;  . TOTAL KNEE ARTHROPLASTY      Current Medications: No outpatient medications have been marked as taking for the 10/15/20 encounter (Appointment) with Donato Heinz, MD.     Allergies:   Lisinopril   Social History   Socioeconomic History  . Marital status: Divorced    Spouse name: Not on file  . Number of children: Not on file  . Years of education: Not on file  . Highest education level: Not on file  Occupational History  . Not on file  Tobacco Use  . Smoking status: Current Every Day Smoker    Packs/day: 2.00    Types: Cigarettes    Last attempt to quit: 10/22/2011    Years since quitting: 8.9  . Smokeless tobacco: Never Used  Substance and Sexual Activity  . Alcohol use: Yes    Comment: 1-2 glasses white wine  . Drug use: No  . Sexual activity: Not on file  Other Topics Concern  . Not on file  Social History Narrative  . Not on file   Social Determinants of Health   Financial Resource Strain: Not on file  Food Insecurity: Not on file  Transportation Needs: Not on file  Physical Activity: Not on file  Stress: Not on file  Social Connections: Not on  file     Family History: The patient's family history includes Stroke in her mother.  ROS:   Please see the history of present illness.     All other systems reviewed and are negative.  EKGs/Labs/Other Studies Reviewed:    The following studies were reviewed today:   EKG:  EKG is ordered today.  The ekg ordered today demonstrates normal sinus rhythm, rate 71, right axis deviation, no ST abnormalities  Recent Labs: 07/16/2020: BUN 16; Creatinine, Ser 0.93; Potassium 4.8; Sodium 140  Recent Lipid Panel No results found for: CHOL, TRIG, HDL, CHOLHDL, VLDL, LDLCALC, LDLDIRECT  Physical Exam:    VS:  There were no vitals taken for this visit.    Wt Readings from Last 3  Encounters:  07/16/20 231 lb (104.8 kg)  04/05/15 233 lb (105.7 kg)  02/11/15 224 lb 6 oz (101.8 kg)     GEN: Well nourished, well developed in no acute distress HEENT: Normal NECK: No JVD; No carotid bruits LYMPHATICS: No lymphadenopathy CARDIAC: RRR, no murmurs, rubs, gallops RESPIRATORY:  Clear to auscultation without rales, wheezing or rhonchi  ABDOMEN: Soft, non-tender, non-distended MUSCULOSKELETAL:  No edema; No deformity  SKIN: Warm and dry NEUROLOGIC:  Alert and oriented x 3 PSYCHIATRIC:  Normal affect   ASSESSMENT:    No diagnosis found. PLAN:    DOE: Given worsening symptoms since COVID-19 infection in August, will check echocardiogram to rule out myocardial involvement.  Also has significant CAD risk factors (hypertension, hyperlipidemia, tobacco use, age).  Will check Lexiscan Myoview to evaluate for ischemia.  Chest pain: Atypical in description, describes pleuritic pain that lasts for few seconds.  No EKG changes to suggest pericarditis.  ESR normal, CRP 12 on 07/16/1940  Lightheadedness: Orthostatics in clinic at prior visit were unremarkable.  Hypertension: On hydrochlorothiazide 25 mg daily.  Appears controlled.  Will check BMP  Hyperlipidemia: On atorvastatin 20 mg daily  RTC in***    Medication Adjustments/Labs and Tests Ordered: Current medicines are reviewed at length with the patient today.  Concerns regarding medicines are outlined above.  No orders of the defined types were placed in this encounter.  No orders of the defined types were placed in this encounter.   There are no Patient Instructions on file for this visit.   Signed, Donato Heinz, MD  10/15/2020 6:45 AM    [place]

## 2020-12-09 DIAGNOSIS — M25511 Pain in right shoulder: Secondary | ICD-10-CM | POA: Diagnosis not present

## 2020-12-09 DIAGNOSIS — M25512 Pain in left shoulder: Secondary | ICD-10-CM | POA: Diagnosis not present

## 2020-12-22 DIAGNOSIS — E538 Deficiency of other specified B group vitamins: Secondary | ICD-10-CM | POA: Diagnosis not present

## 2020-12-29 DIAGNOSIS — E538 Deficiency of other specified B group vitamins: Secondary | ICD-10-CM | POA: Diagnosis not present

## 2021-01-19 DIAGNOSIS — E538 Deficiency of other specified B group vitamins: Secondary | ICD-10-CM | POA: Diagnosis not present

## 2021-01-26 DIAGNOSIS — E538 Deficiency of other specified B group vitamins: Secondary | ICD-10-CM | POA: Diagnosis not present

## 2021-03-06 NOTE — Progress Notes (Signed)
Cardiology Office Note:    Date:  03/07/2021   ID:  DESIRAE MANCUSI, DOB [birth year], MRN [id number]  PCP:  Shirlean Mylar, MD  Cardiologist:  None  Electrophysiologist:  None   Referring MD: Shirlean Mylar, MD   Chief Complaint  Patient presents with   Shortness of Breath     History of Present Illness:    Jamie Gilmore is a 75 y.o. female with a hx of hypertension, DVT following knee replacement, tobacco use, hyperlipidemia who presents for follow-up.  She was referred by Dr. Hyman Hopes for evaluation of chest pain and dyspnea following COVID-19 infection, initially seen on 07/16/2020.  She reports that she has had shortness of breath for years.  Had COVID-19 infection in August 2021.  Reports that dyspnea has worsened since that time.  Reports shortness of breath with minimal exertion.  Also reports she has been having chest pain with deep breathing.  Occurs about once per week, described as sharp pain.  Reports lightheadedness with exertion, denies any syncope.  States that she is lightheaded with standing.  Does report some lower extremity edema at night.  Denies any palpitations.  Recently quit smoking.  Family history includes father had CHF, died at age 70.  At initial clinic visit, echocardiogram and Lexiscan Myoview were ordered, but neither has been done.  Since last clinic visit, she reports she has been having issues with shortness of breath and lightheadedness/near syncope.  Reports shortness of breath has been occurring a few times per week, occurs with minimal exertion.  Had episode of severe shortness of breath while walking in [employer] last week, lasting 10 to 15 minutes.  She denies any chest pain.  Reports feeling lightheaded during these episodes, but also has been having episodes of sudden lightheadedness/near syncope that can occur at rest.  She denies any palpitations.  Also reports leg swelling at the end of the day.  Past Medical History:  Diagnosis Date   Acute blood loss  anemia 08/01/2012   Arthritis    Depression    Elevated blood sugar    "borderline diabetes"   GERD (gastroesophageal reflux disease)    Headache(784.0)    history migraines   Hypertension    Peripheral vascular disease (HCC) 05/2010   DVT post op knee replacement   Shortness of breath    "all my life"- rarely use    Past Surgical History:  Procedure Laterality Date   CARPAL TUNNEL RELEASE     left   JOINT REPLACEMENT  05/2010   left knee   RECTOVAGINAL FISTULA CLOSURE     TONSILLECTOMY     TOTAL HIP ARTHROPLASTY  07/29/2012   Procedure: TOTAL HIP ARTHROPLASTY;  Surgeon: Jacki Cones, MD;  Location: [facility];  Service: Orthopedics;  Laterality: Left;   TOTAL KNEE ARTHROPLASTY      Current Medications: Current Meds  Medication Sig   albuterol (PROVENTIL HFA;VENTOLIN HFA) 108 (90 BASE) MCG/ACT inhaler Inhale 2 puffs into the lungs every 6 (six) hours as needed. Wheezing and shortness of breath   ALPRAZolam (XANAX) 0.25 MG tablet alprazolam 0.25 mg tablet   aspirin EC 81 MG tablet Take 1 tablet (81 mg total) by mouth daily. Swallow whole.   atorvastatin (LIPITOR) 20 MG tablet Take 20 mg by mouth daily.   FLUoxetine (PROZAC) 20 MG tablet Take 20 mg by mouth daily before breakfast.   gabapentin (NEURONTIN) 300 MG capsule Take 300 mg by mouth at bedtime.   hydrochlorothiazide (HYDRODIURIL) 25 MG tablet  Take 25 mg by mouth daily before breakfast.    HYDROcodone-acetaminophen (NORCO/VICODIN) 5-325 MG tablet hydrocodone 5 mg-acetaminophen 325 mg tablet   metoprolol tartrate (LOPRESSOR) 50 MG tablet Take 1 tablet (50 mg total) by mouth once for 1 dose. PLEASE TAKE METOPROLOL 2  HOURS PRIOR TO CTA SCAN.   pantoprazole (PROTONIX) 40 MG tablet pantoprazole 40 mg tablet,delayed release   traMADol (ULTRAM) 50 MG tablet Take 50 mg by mouth every 6 (six) hours as needed for moderate pain.   traZODone (DESYREL) 50 MG tablet Take 1 tablet (50 mg total) by mouth at bedtime.     Allergies:    Lisinopril   Social History   Socioeconomic History   Marital status: Divorced    Spouse name: Not on file   Number of children: Not on file   Years of education: Not on file   Highest education level: Not on file  Occupational History   Not on file  Tobacco Use   Smoking status: Every Day    Packs/day: 2.00    Types: Cigarettes    Last attempt to quit: 10/22/2011    Years since quitting: 9.3   Smokeless tobacco: Never  Substance and Sexual Activity   Alcohol use: Yes    Comment: 1-2 glasses white wine   Drug use: No   Sexual activity: Not on file  Other Topics Concern   Not on file  Social History Narrative   Not on file   Social Determinants of Health   Financial Resource Strain: Not on file  Food Insecurity: Not on file  Transportation Needs: Not on file  Physical Activity: Not on file  Stress: Not on file  Social Connections: Not on file     Family History: The patient's family history includes Stroke in her mother.  ROS:   Please see the history of present illness.     All other systems reviewed and are negative.  EKGs/Labs/Other Studies Reviewed:    The following studies were reviewed today:   EKG:  EKG is ordered today.  The ekg ordered today demonstrates normal sinus rhythm, rate 68, no ST abnormalities  Recent Labs: 07/16/2020: BUN 16; Creatinine, Ser 0.93; Potassium 4.8; Sodium 140  Recent Lipid Panel No results found for: CHOL, TRIG, HDL, CHOLHDL, VLDL, LDLCALC, LDLDIRECT  Physical Exam:    VS:  BP 115/77   Pulse 68   Ht 5\' 4"  (1.626 m)   Wt 239 lb 6.4 oz (108.6 kg)   SpO2 98%   BMI 41.09 kg/m     Wt Readings from Last 3 Encounters:  03/07/21 239 lb 6.4 oz (108.6 kg)  07/16/20 231 lb (104.8 kg)  04/05/15 233 lb (105.7 kg)     GEN: Well nourished, well developed in no acute distress HEENT: Normal NECK: No JVD; No carotid bruits LYMPHATICS: No lymphadenopathy CARDIAC: RRR, no murmurs, rubs, gallops RESPIRATORY:  Clear to  auscultation without rales, wheezing or rhonchi  ABDOMEN: Soft, non-tender, non-distended MUSCULOSKELETAL:  No edema; No deformity  SKIN: Warm and dry NEUROLOGIC:  Alert and oriented x 3 PSYCHIATRIC:  Normal affect   ASSESSMENT:    1. DOE (dyspnea on exertion)   2. Near syncope   3. Essential hypertension   4. Hyperlipidemia, unspecified hyperlipidemia type   5. Snoring     PLAN:    DOE: Reports dyspnea with minimal exertion.Has significant CAD risk factors (hypertension, hyperlipidemia, tobacco use, age), could represent anginal equivalent. -Recommend coronary CTA.  Will give Lopressor 50 mg prior  to study -Echocardiogram  Lightheadedness/near syncope: Orthostatics at previous clinic visit were unremarkable.  Description concerning for arrhythmia, will check Zio patch x7 days  Hypertension: On hydrochlorothiazide 25 mg daily.  Appears controlled.  Will check BMP  Hyperlipidemia: On atorvastatin 20 mg daily.  LDL 104 on 06/30/2020.  We will follow-up results of coronary CTA to guide how aggressive to be in lowering cholesterol  Snoring: Will check sleep study  RTC in 6 months     Medication Adjustments/Labs and Tests Ordered: Current medicines are reviewed at length with the patient today.  Concerns regarding medicines are outlined above.  Orders Placed This Encounter  Procedures   CT CORONARY MORPH W/CTA COR W/SCORE W/CA W/CM &/OR WO/CM   Basic metabolic panel   LONG TERM MONITOR (3-14 DAYS)   EKG 12-Lead   ECHOCARDIOGRAM COMPLETE   Home sleep test    Meds ordered this encounter  Medications   metoprolol tartrate (LOPRESSOR) 50 MG tablet    Sig: Take 1 tablet (50 mg total) by mouth once for 1 dose. PLEASE TAKE METOPROLOL 2  HOURS PRIOR TO CTA SCAN.    Dispense:  1 tablet    Refill:  0     Patient Instructions  Medication Instructions:  PLEASE TAKE  METOPROLOL TARTRATE 2 HOURS PRIOR TO CCTA SCAN  *If you need a refill on your cardiac medications before  your next appointment, please call your pharmacy*  Lab Work: BMET- ONE WEEK PRIOR TO CCTA  If you have labs (blood work) drawn today and your tests are completely normal, you will receive your results only by: MyChart Message (if you have MyChart) OR A paper copy in the mail If you have any lab test that is abnormal or we need to change your treatment, we will call you to review the results.  Testing/Procedures: Your physician has requested that you have an echocardiogram. Echocardiography is a painless test that uses sound waves to create images of your heart. It provides your doctor with information about the size and shape of your heart and how well your heart's chambers and valves are working. You may receive an ultrasound enhancing agent through an IV if needed to better visualize your heart during the echo.This procedure takes approximately one hour. There are no restrictions for this procedure. This will take place at the [address], [address].   Your physician has requested that you have cardiac CT. Cardiac computed tomography (CT) is a painless test that uses an x-ray machine to take clear, detailed pictures of your heart. For further information please visit [email or web address] Please follow instruction sheet as given.   ZIO XT- Long Term Monitor Instructions   Your physician has requested you wear your ZIO patch monitor 7 days.   This is a single patch monitor.  [employer] supplies one patch monitor per enrollment.  Additional stickers are not available.   Please do not apply patch if you will be having a Nuclear Stress Test, Echocardiogram, Cardiac CT, MRI, or Chest Xray during the time frame you would be wearing the monitor. The patch cannot be worn during these tests.  You cannot remove and re-apply the ZIO XT patch monitor.   Your ZIO patch monitor will be sent USPS Priority mail from [facility] directly to your home address. The monitor may also be mailed to a PO  BOX if home delivery is not available.   It may take 3-5 days to receive your monitor after you have been enrolled.  Once you have received you monitor, please review enclosed instructions.  Your monitor has already been registered assigning a specific monitor serial # to you.   Applying the monitor   Shave hair from upper left chest.   Hold abrader disc by orange tab.  Rub abrader in 40 strokes over left upper chest as indicated in your monitor instructions.   Clean area with 4 enclosed alcohol pads .  Use all pads to assure are is cleaned thoroughly.  Let dry.   Apply patch as indicated in monitor instructions.  Patch will be place under collarbone on left side of chest with arrow pointing upward.   Rub patch adhesive wings for 2 minutes.Remove white label marked "1".  Remove white label marked "2".  Rub patch adhesive wings for 2 additional minutes.   While looking in a mirror, press and release button in center of patch.  A small green light will flash 3-4 times .  This will be your only indicator the monitor has been turned on.     Do not shower for the first 24 hours.  You may shower after the first 24 hours.   Press button if you feel a symptom. You will hear a small click.  Record Date, Time and Symptom in the Patient Log Book.   When you are ready to remove patch, follow instructions on last 2 pages of Patient Log Book.  Stick patch monitor onto last page of Patient Log Book.   Place Patient Log Book in [place] box.  Use locking tab on box and tape box closed securely.  The [employer] has [employer] on it.  Please place in mailbox as soon as possible.  Your physician should have your test results approximately 7 days after the monitor has been mailed back to [facility].   Call [facility] Customer Care at [phone] if you have questions regarding your ZIO XT patch monitor.  Call them immediately if you see an orange light blinking on your monitor.   If  your monitor falls off in less than 4 days contact our Monitor department at [phone].  If your monitor becomes loose or falls off after 4 days call [employer] at [phone] for suggestions on securing your monitor.    Your physician has recommended that you have a sleep study. This test records several body functions during sleep, including: brain activity, eye movement, oxygen and carbon dioxide blood levels, heart rate and rhythm, breathing rate and rhythm, the flow of air through your mouth and nose, snoring, body muscle movements, and chest and belly movement. SOMEONE WILL REACH OUT TO YOU TO SCHEDULE THIS   Follow-Up: At , you and your health needs are our priority.  As part of our continuing mission to provide you with exceptional heart care, we have created designated Provider Care Teams.  These Care Teams include your primary Cardiologist (physician) and Advanced Practice Providers (APPs -  Physician Assistants and Nurse Practitioners) who all work together to provide you with the care you need, when you need it.  We recommend signing up for the patient portal called "MyChart".  Sign up information is provided on this After Visit Summary.  MyChart is used to connect with patients for Virtual Visits (Telemedicine).  Patients are able to view lab/test results, encounter notes, upcoming appointments, etc.  Non-urgent messages can be sent to your provider as well.   To learn more about what you can do with MyChart, go to [email or web address].  Your next appointment:   6 month(s)  The format for your next appointment:   In Person  Provider:   Epifanio Lescheshristopher Sufyaan Palma, MD  Other Instructions   Your cardiac CT will be scheduled at one of the below locations:   [facility] [address] [place], [place] [address] [phone]  If scheduled at [facility], please arrive at the [facility] main entrance (entrance A) of [facility] 30  minutes prior to test start time. Proceed to the [facility] Radiology Department (first floor) to check-in and test prep.  Please follow these instructions carefully (unless otherwise directed):  On the Night Before the Test: Be sure to Drink plenty of water. Do not consume any caffeinated/decaffeinated beverages or chocolate 12 hours prior to your test. Do not take any antihistamines 12 hours prior to your test.  On the Day of the Test: Drink plenty of water until 1 hour prior to the test. Do not eat any food 4 hours prior to the test. You may take your regular medications prior to the test.  Take metoprolol (Lopressor) 50mg  two hours prior to test. HOLD Furosemide/Hydrochlorothiazide morning of the test. FEMALES- please wear underwire-free bra if available, avoid dresses & tight clothing      After the Test: Drink plenty of water. After receiving IV contrast, you may experience a mild flushed feeling. This is normal. On occasion, you may experience a mild rash up to 24 hours after the test. This is not dangerous. If this occurs, you can take Benadryl 25 mg and increase your fluid intake. If you experience trouble breathing, this can be serious. If it is severe call 911 IMMEDIATELY. If it is mild, please call our office. If you take any of these medications: Glipizide/Metformin, Avandament, Glucavance, please do not take 48 hours after completing test unless otherwise instructed.  Please allow 2-4 weeks for scheduling of routine cardiac CTs. Some insurance companies require a pre-authorization which may delay scheduling of this test.   For non-scheduling related questions, please contact the cardiac imaging nurse navigator should you have any questions/concerns: Rockwell AlexandriaSara Wallace, Cardiac Imaging Nurse Navigator Larey BrickMerle Prescott, Cardiac Imaging Nurse Navigator [facility] Direct Office Dial: [phone]   For scheduling needs, including cancellations and  rescheduling, please call [place], [phone].    Signed, Little Ishikawahristopher L Traci Gafford, MD  03/07/2021 8:55 AM    [facility]

## 2021-03-07 ENCOUNTER — Telehealth: Payer: Self-pay | Admitting: Cardiology

## 2021-03-07 ENCOUNTER — Other Ambulatory Visit: Payer: Self-pay

## 2021-03-07 ENCOUNTER — Ambulatory Visit: Payer: Medicare HMO | Admitting: Cardiology

## 2021-03-07 ENCOUNTER — Encounter: Payer: Self-pay | Admitting: Cardiology

## 2021-03-07 ENCOUNTER — Telehealth: Payer: Self-pay | Admitting: *Deleted

## 2021-03-07 ENCOUNTER — Ambulatory Visit (INDEPENDENT_AMBULATORY_CARE_PROVIDER_SITE_OTHER): Payer: Medicare HMO

## 2021-03-07 VITALS — BP 115/77 | HR 68 | Ht 64.0 in | Wt 239.4 lb

## 2021-03-07 DIAGNOSIS — R0609 Other forms of dyspnea: Secondary | ICD-10-CM

## 2021-03-07 DIAGNOSIS — R0683 Snoring: Secondary | ICD-10-CM

## 2021-03-07 DIAGNOSIS — E785 Hyperlipidemia, unspecified: Secondary | ICD-10-CM | POA: Diagnosis not present

## 2021-03-07 DIAGNOSIS — R0600 Dyspnea, unspecified: Secondary | ICD-10-CM

## 2021-03-07 DIAGNOSIS — R55 Syncope and collapse: Secondary | ICD-10-CM | POA: Diagnosis not present

## 2021-03-07 DIAGNOSIS — I10 Essential (primary) hypertension: Secondary | ICD-10-CM | POA: Diagnosis not present

## 2021-03-07 MED ORDER — METOPROLOL TARTRATE 50 MG PO TABS: 50.0000 mg | ORAL_TABLET | Freq: Once | ORAL | 0 refills | Status: DC

## 2021-03-07 NOTE — Telephone Encounter (Signed)
*  STAT* If patient is at the pharmacy, call can be transferred to refill team.   1. Which medications need to be refilled? (please list name of each medication and dose if known) metoprolol tartrate (LOPRESSOR) 50 MG tablet  2. Which pharmacy/location (including street and city if local pharmacy) is medication to be sent to? [id number] - [place], [place] - [address] AT [address] OF [facility] [address]  3. Do they need a 30 day or 90 day supply? 90  Patient states the first prescription was sent to the wrong pharmacy

## 2021-03-07 NOTE — Telephone Encounter (Signed)
Left HST appointment details on cell voicemail. ?

## 2021-03-07 NOTE — Patient Instructions (Addendum)
Medication Instructions:  PLEASE TAKE 50mg  METOPROLOL TARTRATE 2 HOURS PRIOR TO CCTA SCAN  *If you need a refill on your cardiac medications before your next appointment, please call your pharmacy*  Lab Work: BMET- ONE WEEK PRIOR TO CCTA  If you have labs (blood work) drawn today and your tests are completely normal, you will receive your results only by: MyChart Message (if you have MyChart) OR A paper copy in the mail If you have any lab test that is abnormal or we need to change your treatment, we will call you to review the results.  Testing/Procedures: Your physician has requested that you have an echocardiogram. Echocardiography is a painless test that uses sound waves to create images of your heart. It provides your doctor with information about the size and shape of your heart and how well your heart's chambers and valves are working. You may receive an ultrasound enhancing agent through an IV if needed to better visualize your heart during the echo.This procedure takes approximately one hour. There are no restrictions for this procedure. This will take place at the [address].   Your physician has requested that you have cardiac CT. Cardiac computed tomography (CT) is a painless test that uses an x-ray machine to take clear, detailed pictures of your heart. For further information please visit [address]. Please follow instruction sheet as given.   ZIO XT- Long Term Monitor Instructions   Your physician has requested you wear your ZIO patch monitor 7 days.   This is a single patch monitor.  [employer] supplies one patch monitor per enrollment.  Additional stickers are not available.   Please do not apply patch if you will be having a Nuclear Stress Test, Echocardiogram, Cardiac CT, MRI, or Chest Xray during the time frame you would be wearing the monitor. The patch cannot be worn during these tests.  You cannot remove and re-apply the ZIO XT patch monitor.    Your ZIO patch monitor will be sent USPS Priority mail from [facility] directly to your home address. The monitor may also be mailed to a PO BOX if home delivery is not available.   It may take 3-5 days to receive your monitor after you have been enrolled.   Once you have received you monitor, please review enclosed instructions.  Your monitor has already been registered assigning a specific monitor serial # to you.   Applying the monitor   Shave hair from upper left chest.   Hold abrader disc by orange tab.  Rub abrader in 40 strokes over left upper chest as indicated in your monitor instructions.   Clean area with 4 enclosed alcohol pads .  Use all pads to assure are is cleaned thoroughly.  Let dry.   Apply patch as indicated in monitor instructions.  Patch will be place under collarbone on left side of chest with arrow pointing upward.   Rub patch adhesive wings for 2 minutes.Remove white label marked "1".  Remove white label marked "2".  Rub patch adhesive wings for 2 additional minutes.   While looking in a mirror, press and release button in center of patch.  A small green light will flash 3-4 times .  This will be your only indicator the monitor has been turned on.     Do not shower for the first 24 hours.  You may shower after the first 24 hours.   Press button if you feel a symptom. You will hear a small  click.  Record Date, Time and Symptom in the Patient Log Book.   When you are ready to remove patch, follow instructions on last 2 pages of Patient Log Book.  Stick patch monitor onto last page of Patient Log Book.   Place Patient Log Book in [place] box.  Use locking tab on box and tape box closed securely.  The [employer] has [employer] on it.  Please place in mailbox as soon as possible.  Your physician should have your test results approximately 7 days after the monitor has been mailed back to [facility].   Call [facility] Customer Care at  [phone] if you have questions regarding your ZIO XT patch monitor.  Call them immediately if you see an orange light blinking on your monitor.   If your monitor falls off in less than 4 days contact our Monitor department at [phone].  If your monitor becomes loose or falls off after 4 days call [employer] at [phone] for suggestions on securing your monitor.    Your physician has recommended that you have a sleep study. This test records several body functions during sleep, including: brain activity, eye movement, oxygen and carbon dioxide blood levels, heart rate and rhythm, breathing rate and rhythm, the flow of air through your mouth and nose, snoring, body muscle movements, and chest and belly movement. SOMEONE WILL REACH OUT TO YOU TO SCHEDULE THIS   Follow-Up: At [facility], you and your health needs are our priority.  As part of our continuing mission to provide you with exceptional heart care, we have created designated Provider Care Teams.  These Care Teams include your primary Cardiologist (physician) and Advanced Practice Providers (APPs -  Physician Assistants and Nurse Practitioners) who all work together to provide you with the care you need, when you need it.  We recommend signing up for the patient portal called "MyChart".  Sign up information is provided on this After Visit Summary.  MyChart is used to connect with patients for Virtual Visits (Telemedicine).  Patients are able to view lab/test results, encounter notes, upcoming appointments, etc.  Non-urgent messages can be sent to your provider as well.   To learn more about what you can do with MyChart, go to [email or web address].    Your next appointment:   6 month(s)  The format for your next appointment:   In Person  Provider:   Epifanio Lesches, MD  Other Instructions   Your cardiac CT will be scheduled at one of the below locations:   [facility] [address] [place], [place] [address] [phone]  If scheduled at [facility], please arrive at the [facility] main entrance (entrance A) of [facility] 30 minutes prior to test start time. Proceed to the [facility] Radiology Department (first floor) to check-in and test prep.  Please follow these instructions carefully (unless otherwise directed):  On the Night Before the Test: Be sure to Drink plenty of water. Do not consume any caffeinated/decaffeinated beverages or chocolate 12 hours prior to your test. Do not take any antihistamines 12 hours prior to your test.  On the Day of the Test: Drink plenty of water until 1 hour prior to the test. Do not eat any food 4 hours prior to the test. You may take your regular medications prior to the test.  Take metoprolol (Lopressor) 50mg  two hours prior to test. HOLD Furosemide/Hydrochlorothiazide morning of the test. FEMALES- please wear underwire-free bra if available,  avoid dresses & tight clothing      After the Test: Drink plenty of water. After receiving IV contrast, you may experience a mild flushed feeling. This is normal. On occasion, you may experience a mild rash up to 24 hours after the test. This is not dangerous. If this occurs, you can take Benadryl 25 mg and increase your fluid intake. If you experience trouble breathing, this can be serious. If it is severe call 911 IMMEDIATELY. If it is mild, please call our office. If you take any of these medications: Glipizide/Metformin, Avandament, Glucavance, please do not take 48 hours after completing test unless otherwise instructed.  Please allow 2-4 weeks for scheduling of routine cardiac CTs. Some insurance companies require a pre-authorization which may delay scheduling of this test.   For non-scheduling related questions, please contact the cardiac imaging nurse navigator should you have any questions/concerns: Rockwell Alexandria, Cardiac Imaging Nurse Navigator Larey Brick, Cardiac Imaging Nurse Navigator [facility] Direct Office Dial: [phone]   For scheduling needs, including cancellations and rescheduling, please call [place], [phone].

## 2021-03-07 NOTE — Progress Notes (Unsigned)
Patient enrolled for [employer] to mail a 7 day ZIO XT monitor to her home. 

## 2021-03-09 ENCOUNTER — Ambulatory Visit (HOSPITAL_COMMUNITY): Payer: Medicare HMO | Attending: Cardiovascular Disease

## 2021-03-09 ENCOUNTER — Other Ambulatory Visit: Payer: Self-pay

## 2021-03-09 DIAGNOSIS — R0683 Snoring: Secondary | ICD-10-CM | POA: Diagnosis not present

## 2021-03-09 DIAGNOSIS — R55 Syncope and collapse: Secondary | ICD-10-CM

## 2021-03-09 DIAGNOSIS — R0600 Dyspnea, unspecified: Secondary | ICD-10-CM

## 2021-03-09 DIAGNOSIS — I10 Essential (primary) hypertension: Secondary | ICD-10-CM | POA: Diagnosis not present

## 2021-03-09 DIAGNOSIS — R0609 Other forms of dyspnea: Secondary | ICD-10-CM

## 2021-03-09 LAB — ECHOCARDIOGRAM COMPLETE
Area-P 1/2: 3.19 cm2
S' Lateral: 3.7 cm

## 2021-03-10 DIAGNOSIS — R0600 Dyspnea, unspecified: Secondary | ICD-10-CM

## 2021-03-10 DIAGNOSIS — R55 Syncope and collapse: Secondary | ICD-10-CM | POA: Diagnosis not present

## 2021-03-11 DIAGNOSIS — I10 Essential (primary) hypertension: Secondary | ICD-10-CM | POA: Diagnosis not present

## 2021-03-11 DIAGNOSIS — R0600 Dyspnea, unspecified: Secondary | ICD-10-CM | POA: Diagnosis not present

## 2021-03-11 DIAGNOSIS — R55 Syncope and collapse: Secondary | ICD-10-CM | POA: Diagnosis not present

## 2021-03-11 DIAGNOSIS — E785 Hyperlipidemia, unspecified: Secondary | ICD-10-CM | POA: Diagnosis not present

## 2021-03-11 DIAGNOSIS — R0683 Snoring: Secondary | ICD-10-CM | POA: Diagnosis not present

## 2021-03-12 LAB — BASIC METABOLIC PANEL
BUN/Creatinine Ratio: 15 (ref 12–28)
BUN: 12 mg/dL (ref 8–27)
CO2: 25 mmol/L (ref 20–29)
Calcium: 9.3 mg/dL (ref 8.7–10.3)
Chloride: 100 mmol/L (ref 96–106)
Creatinine, Ser: 0.8 mg/dL (ref 0.57–1.00)
Glucose: 77 mg/dL (ref 65–99)
Potassium: 4.6 mmol/L (ref 3.5–5.2)
Sodium: 140 mmol/L (ref 134–144)
eGFR: 78 mL/min/{1.73_m2} (ref >59)

## 2021-03-15 ENCOUNTER — Telehealth (HOSPITAL_COMMUNITY): Payer: Self-pay | Admitting: Emergency Medicine

## 2021-03-15 NOTE — Telephone Encounter (Signed)
Attempted to call patient regarding upcoming cardiac CT appointment. °Left message on voicemail with name and callback number °Ricci Dirocco RN Navigator Cardiac Imaging °[facility] °[phone] Office °[phone] Cell ° °

## 2021-03-16 ENCOUNTER — Encounter (HOSPITAL_COMMUNITY): Payer: Self-pay

## 2021-03-16 ENCOUNTER — Ambulatory Visit (HOSPITAL_COMMUNITY): Payer: Medicare HMO

## 2021-03-17 NOTE — Telephone Encounter (Signed)
Would recommend the CT to determine what is causing her shortness of breath if she is agreeable

## 2021-03-18 DIAGNOSIS — R55 Syncope and collapse: Secondary | ICD-10-CM | POA: Diagnosis not present

## 2021-03-18 DIAGNOSIS — R0600 Dyspnea, unspecified: Secondary | ICD-10-CM | POA: Diagnosis not present

## 2021-04-05 ENCOUNTER — Encounter (HOSPITAL_BASED_OUTPATIENT_CLINIC_OR_DEPARTMENT_OTHER): Payer: Medicare HMO | Admitting: Cardiovascular Disease

## 2021-04-07 DIAGNOSIS — M25511 Pain in right shoulder: Secondary | ICD-10-CM | POA: Diagnosis not present

## 2021-04-07 DIAGNOSIS — M25552 Pain in left hip: Secondary | ICD-10-CM | POA: Diagnosis not present

## 2021-04-07 DIAGNOSIS — M25512 Pain in left shoulder: Secondary | ICD-10-CM | POA: Diagnosis not present

## 2021-04-22 ENCOUNTER — Encounter: Payer: Self-pay | Admitting: *Deleted

## 2021-05-17 DIAGNOSIS — J988 Other specified respiratory disorders: Secondary | ICD-10-CM | POA: Diagnosis not present

## 2021-05-17 DIAGNOSIS — R059 Cough, unspecified: Secondary | ICD-10-CM | POA: Diagnosis not present

## 2021-05-18 DIAGNOSIS — Z03818 Encounter for observation for suspected exposure to other biological agents ruled out: Secondary | ICD-10-CM | POA: Diagnosis not present

## 2021-05-18 DIAGNOSIS — R051 Acute cough: Secondary | ICD-10-CM | POA: Diagnosis not present

## 2021-05-24 DIAGNOSIS — R0602 Shortness of breath: Secondary | ICD-10-CM | POA: Diagnosis not present

## 2021-05-24 DIAGNOSIS — R5383 Other fatigue: Secondary | ICD-10-CM | POA: Diagnosis not present

## 2021-05-25 ENCOUNTER — Other Ambulatory Visit: Payer: Self-pay | Admitting: Family Medicine

## 2021-05-25 ENCOUNTER — Ambulatory Visit
Admission: RE | Admit: 2021-05-25 | Discharge: 2021-05-25 | Disposition: A | Discharge status: Home / Self Care | Payer: Medicare HMO | Source: Ambulatory Visit | Attending: Family Medicine | Admitting: Family Medicine

## 2021-05-25 DIAGNOSIS — R0602 Shortness of breath: Secondary | ICD-10-CM | POA: Diagnosis not present

## 2021-05-25 DIAGNOSIS — R059 Cough, unspecified: Secondary | ICD-10-CM | POA: Diagnosis not present

## 2021-07-28 DIAGNOSIS — M25511 Pain in right shoulder: Secondary | ICD-10-CM | POA: Diagnosis not present

## 2021-07-28 DIAGNOSIS — M25512 Pain in left shoulder: Secondary | ICD-10-CM | POA: Diagnosis not present

## 2021-08-22 DIAGNOSIS — Z9842 Cataract extraction status, left eye: Secondary | ICD-10-CM | POA: Diagnosis not present

## 2021-08-22 DIAGNOSIS — H5213 Myopia, bilateral: Secondary | ICD-10-CM | POA: Diagnosis not present

## 2021-08-22 DIAGNOSIS — H52222 Regular astigmatism, left eye: Secondary | ICD-10-CM | POA: Diagnosis not present

## 2021-08-22 DIAGNOSIS — H524 Presbyopia: Secondary | ICD-10-CM | POA: Diagnosis not present

## 2021-08-22 DIAGNOSIS — H2511 Age-related nuclear cataract, right eye: Secondary | ICD-10-CM | POA: Diagnosis not present

## 2021-08-22 DIAGNOSIS — Z961 Presence of intraocular lens: Secondary | ICD-10-CM | POA: Diagnosis not present

## 2021-08-29 DIAGNOSIS — H5213 Myopia, bilateral: Secondary | ICD-10-CM | POA: Diagnosis not present

## 2021-09-02 DIAGNOSIS — H26492 Other secondary cataract, left eye: Secondary | ICD-10-CM | POA: Diagnosis not present

## 2021-09-02 DIAGNOSIS — H25811 Combined forms of age-related cataract, right eye: Secondary | ICD-10-CM | POA: Diagnosis not present

## 2021-09-02 DIAGNOSIS — H40053 Ocular hypertension, bilateral: Secondary | ICD-10-CM | POA: Diagnosis not present

## 2021-09-12 DIAGNOSIS — H2511 Age-related nuclear cataract, right eye: Secondary | ICD-10-CM | POA: Diagnosis not present

## 2021-09-12 DIAGNOSIS — H40053 Ocular hypertension, bilateral: Secondary | ICD-10-CM | POA: Diagnosis not present

## 2021-09-12 DIAGNOSIS — H40051 Ocular hypertension, right eye: Secondary | ICD-10-CM | POA: Diagnosis not present

## 2021-09-12 DIAGNOSIS — H25811 Combined forms of age-related cataract, right eye: Secondary | ICD-10-CM | POA: Diagnosis not present

## 2021-09-14 DIAGNOSIS — R7309 Other abnormal glucose: Secondary | ICD-10-CM | POA: Diagnosis not present

## 2021-09-14 DIAGNOSIS — R0602 Shortness of breath: Secondary | ICD-10-CM | POA: Diagnosis not present

## 2021-09-22 ENCOUNTER — Ambulatory Visit: Payer: Medicare HMO | Admitting: Cardiology

## 2021-10-07 ENCOUNTER — Other Ambulatory Visit: Payer: Self-pay

## 2021-10-07 ENCOUNTER — Encounter: Payer: Self-pay | Admitting: Pulmonary Disease

## 2021-10-07 ENCOUNTER — Ambulatory Visit (INDEPENDENT_AMBULATORY_CARE_PROVIDER_SITE_OTHER): Payer: Medicare HMO | Admitting: Pulmonary Disease

## 2021-10-07 VITALS — BP 124/80 | HR 70 | Ht 64.0 in | Wt 240.8 lb

## 2021-10-07 DIAGNOSIS — Z87891 Personal history of nicotine dependence: Secondary | ICD-10-CM

## 2021-10-07 DIAGNOSIS — R0602 Shortness of breath: Secondary | ICD-10-CM

## 2021-10-07 MED ORDER — STIOLTO RESPIMAT 2.5-2.5 MCG/ACT IN AERS: 2.0000 | INHALATION_SPRAY | Freq: Every day | RESPIRATORY_TRACT | 0 refills | Status: AC

## 2021-10-07 NOTE — Progress Notes (Signed)
ok  Synopsis: Referred in March 2023 for Shortness of breath by Sela Hilding, MD  Subjective:   PATIENT ID: Jamie Gilmore: female DOB: 08-10-1945, MRN: [id number]   HPI  Chief Complaint  Patient presents with   Consult    Referred by PCP for evaluation for COPD. States she has struggled with SOB for many years.    Jamie Gilmore is a 76 year old woman, daily smoker with GERD, hypertension and history of DVT who is referred to pulmonary clinic for shortness of breath.   She reports progressive shortness of breath over many years. This year she has noticed it hindering her from basic activities such as cleaning her house and cooking. She becomes to short of breath to cook or clean and has to take frequent breaks. She denies any cough or wheezing.  She does have sinus congestion and postnasal drainage.  She reports she is constantly clearing her throat.  She denies any lower extremity edema, chest pain or paroxysmal nocturnal dyspnea.  She has significant musculoskeletal issues with bilateral hip and knee pain.  She had left hip and knee replacements previously.  She was walking up to 1.5 miles per day until last year when she had to stop due to the joint pain.  She has gained a significant amount of weight over recent years.  Records and labs reviewed from her primary care team.  She had a serum bicarb level of 35 mmol/L on 09/16/2021.  She quit smoking in 2018.  She has over a 20 pack year smoking history.  Her father had heart disease and asthma.  Past Medical History:  Diagnosis Date   Acute blood loss anemia 08/01/2012   Arthritis    Depression    Elevated blood sugar    "borderline diabetes"   GERD (gastroesophageal reflux disease)    Headache(784.0)    history migraines   Hypertension    Peripheral vascular disease ([place]) 05/2010   DVT post op knee replacement   Shortness of breath    "all my life"- rarely use     Family History  Problem Relation Age of Onset    Stroke Mother      Social History   Socioeconomic History   Marital status: Divorced    Spouse name: Not on file   Number of children: Not on file   Years of education: Not on file   Highest education level: Not on file  Occupational History   Not on file  Tobacco Use   Smoking status: Former    Packs/day: 2.00    Types: Cigarettes    Quit date: 10/30/2016    Years since quitting: 4.9   Smokeless tobacco: Never  Substance and Sexual Activity   Alcohol use: Yes    Comment: 1-2 glasses white wine   Drug use: No   Sexual activity: Not on file  Other Topics Concern   Not on file  Social History Narrative   Not on file   Social Determinants of Health   Financial Resource Strain: Not on file  Food Insecurity: Not on file  Transportation Needs: Not on file  Physical Activity: Not on file  Stress: Not on file  Social Connections: Not on file  Intimate Partner Violence: Not on file     Allergies  Allergen Reactions   Lisinopril Cough     Outpatient Medications Prior to Visit  Medication Sig Dispense Refill   albuterol (PROVENTIL HFA;VENTOLIN HFA) 108 (90 BASE) MCG/ACT inhaler Inhale 2  puffs into the lungs every 6 (six) hours as needed. Wheezing and shortness of breath     ALPRAZolam (XANAX) 0.25 MG tablet Take 1 tablet by mouth daily as needed.     aspirin 325 MG tablet 1 tablet     atorvastatin (LIPITOR) 20 MG tablet Take 1 tablet by mouth daily.     FLUoxetine (PROZAC) 40 MG capsule      gabapentin (NEURONTIN) 300 MG capsule Take 300 mg by mouth at bedtime.     hydrochlorothiazide (HYDRODIURIL) 25 MG tablet Take 25 mg by mouth daily before breakfast.      metFORMIN (GLUCOPHAGE-XR) 500 MG 24 hr tablet Take 1 tablet by mouth daily.     pantoprazole (PROTONIX) 40 MG tablet Take 1 tablet by mouth daily.     traMADol (ULTRAM) 50 MG tablet Take 50 mg by mouth every 6 (six) hours as needed for moderate pain.     traZODone (DESYREL) 50 MG tablet Take 1 tablet (50 mg  total) by mouth at bedtime. 30 tablet 0   metoprolol tartrate (LOPRESSOR) 50 MG tablet Take 1 tablet (50 mg total) by mouth once for 1 dose. PLEASE TAKE METOPROLOL 2  HOURS PRIOR TO CTA SCAN. 1 tablet 0   ALPRAZolam (XANAX) 0.25 MG tablet alprazolam 0.25 mg tablet     aspirin EC 81 MG tablet Take 1 tablet (81 mg total) by mouth daily. Swallow whole. 90 tablet 3   atorvastatin (LIPITOR) 20 MG tablet Take 20 mg by mouth daily.     FLUoxetine (PROZAC) 20 MG tablet Take 20 mg by mouth daily before breakfast.     HYDROcodone-acetaminophen (NORCO/VICODIN) 5-325 MG tablet hydrocodone 5 mg-acetaminophen 325 mg tablet     indomethacin (INDOCIN) 25 MG capsule Take 25 mg by mouth 2 (two) times daily. (Patient not taking: Reported on 03/07/2021)     pantoprazole (PROTONIX) 40 MG tablet pantoprazole 40 mg tablet,delayed release     No facility-administered medications prior to visit.    Review of Systems  Constitutional:  Negative for chills, fever, malaise/fatigue and weight loss.  HENT:  Negative for congestion, sinus pain and sore throat.   Eyes: Negative.   Respiratory:  Positive for shortness of breath. Negative for cough, hemoptysis, sputum production and wheezing.   Cardiovascular:  Negative for chest pain, palpitations, orthopnea, claudication and leg swelling.  Gastrointestinal:  Negative for abdominal pain, heartburn, nausea and vomiting.  Genitourinary: Negative.   Musculoskeletal:  Negative for joint pain and myalgias.  Skin:  Negative for rash.  Neurological:  Negative for weakness.  Endo/Heme/Allergies: Negative.   Psychiatric/Behavioral: Negative.     Objective:   Vitals:   10/07/21 1504  BP: 124/80  Pulse: 70  SpO2: 97%  Weight: 240 lb 12.8 oz (109.2 kg)  Height: 5\' 4"  (1.626 m)   Physical Exam Constitutional:      General: She is not in acute distress.    Appearance: She is obese. She is not ill-appearing.  HENT:     Head: Normocephalic and atraumatic.  Eyes:      General: No scleral icterus.    Conjunctiva/sclera: Conjunctivae normal.     Pupils: Pupils are equal, round, and reactive to light.  Cardiovascular:     Rate and Rhythm: Normal rate and regular rhythm.     Pulses: Normal pulses.     Heart sounds: Normal heart sounds. No murmur heard. Pulmonary:     Effort: Pulmonary effort is normal.     Breath sounds: Normal breath sounds.  No wheezing, rhonchi or rales.  Abdominal:     General: Bowel sounds are normal.     Palpations: Abdomen is soft.  Musculoskeletal:     Right lower leg: No edema.     Left lower leg: No edema.  Lymphadenopathy:     Cervical: No cervical adenopathy.  Skin:    General: Skin is warm and dry.  Neurological:     General: No focal deficit present.     Mental Status: She is alert.  Psychiatric:        Mood and Affect: Mood normal.        Behavior: Behavior normal.        Thought Content: Thought content normal.        Judgment: Judgment normal.   CBC    Component Value Date/Time   WBC 6.6 04/05/2015 0740   RBC 3.99 04/05/2015 0740   HGB 12.5 04/05/2015 0740   HCT 37.1 04/05/2015 0740   PLT 314 04/05/2015 0740   MCV 93.0 04/05/2015 0740   MCH 31.3 04/05/2015 0740   MCHC 33.7 04/05/2015 0740   RDW 16.2 (H) 04/05/2015 0740   LYMPHSABS 2.4 04/05/2015 0740   MONOABS 0.6 04/05/2015 0740   EOSABS 0.1 04/05/2015 0740   BASOSABS 0.0 04/05/2015 0740   BMP Latest Ref Rng & Units 03/11/2021 07/16/2020 04/06/2015  Glucose 65 - 99 mg/dL 77 96 93  BUN 8 - 27 mg/dL 12 16 9   Creatinine 0.57 - 1.00 mg/dL 0.80 0.93 0.62  BUN/Creat Ratio 12 - 28 15 17  -  Sodium 134 - 144 mmol/L 140 140 142  Potassium 3.5 - 5.2 mmol/L 4.6 4.8 3.6  Chloride 96 - 106 mmol/L 100 100 110  CO2 20 - 29 mmol/L 25 28 26   Calcium 8.7 - 10.3 mg/dL 9.3 9.0 8.2(L)   Chest imaging: CXR 05/25/21 Mild cardiomegaly. Both lungs are clear. The visualized skeletal structures are unremarkable.  PFT: No flowsheet data found.  Labs:  Path:  Echo  03/09/21: EF 60-65%. Normal diastolic function. RV size and function are normal.   Heart Catheterization:  Assessment & Plan:   Shortness of breath - Plan: Pulmonary Function Test  Morbid obesity due to excess calories [facility])  Former smoker - Plan: Ambulatory Referral for Lung Cancer Scre  Discussion: Jamie Gilmore is a 76 year old woman, daily smoker with GERD, hypertension and history of DVT who is referred to pulmonary clinic for shortness of breath.  Her shortness of breath is multifactorial in setting of deconditioning, obesity and possible obstructive lung disease.   She will be working on weight loss and increasing her physical activity as these are personal goals of hers. I recommended she look into a water aerobics course due to her joint issues. I also recommended the use of a walker to help alleviate her hip/knee pain if possible.  She is to try stiolto 2 puffs daily and monitor for any improvement in her breathing. We will send in prescription if she wishes to continue on this inhaler.   Follow up in 6 weeks with pulmonary function tests.  Freda Jackson, MD [facility] Office: [phone]    Current Outpatient Medications:    albuterol (PROVENTIL HFA;VENTOLIN HFA) 108 (90 BASE) MCG/ACT inhaler, Inhale 2 puffs into the lungs every 6 (six) hours as needed. Wheezing and shortness of breath, Disp: , Rfl:    ALPRAZolam (XANAX) 0.25 MG tablet, Take 1 tablet by mouth daily as needed., Disp: , Rfl:  aspirin 325 MG tablet, 1 tablet, Disp: , Rfl:    atorvastatin (LIPITOR) 20 MG tablet, Take 1 tablet by mouth daily., Disp: , Rfl:    FLUoxetine (PROZAC) 40 MG capsule, , Disp: , Rfl:    gabapentin (NEURONTIN) 300 MG capsule, Take 300 mg by mouth at bedtime., Disp: , Rfl:    hydrochlorothiazide (HYDRODIURIL) 25 MG tablet, Take 25 mg by mouth daily before breakfast. , Disp: , Rfl:    metFORMIN (GLUCOPHAGE-XR) 500 MG 24 hr tablet, Take 1 tablet by mouth  daily., Disp: , Rfl:    pantoprazole (PROTONIX) 40 MG tablet, Take 1 tablet by mouth daily., Disp: , Rfl:    Tiotropium Bromide-Olodaterol (STIOLTO RESPIMAT) 2.5-2.5 MCG/ACT AERS, Inhale 2 puffs into the lungs daily., Disp: 8 g, Rfl: 0   traMADol (ULTRAM) 50 MG tablet, Take 50 mg by mouth every 6 (six) hours as needed for moderate pain., Disp: , Rfl:    traZODone (DESYREL) 50 MG tablet, Take 1 tablet (50 mg total) by mouth at bedtime., Disp: 30 tablet, Rfl: 0   metoprolol tartrate (LOPRESSOR) 50 MG tablet, Take 1 tablet (50 mg total) by mouth once for 1 dose. PLEASE TAKE METOPROLOL 2  HOURS PRIOR TO CTA SCAN., Disp: 1 tablet, Rfl: 0

## 2021-10-07 NOTE — Progress Notes (Signed)
Patient seen in the office today and instructed on use of Stiolto.  Patient expressed understanding and demonstrated technique. ? ?Emelia Loron CMA 10/07/2021 ?

## 2021-10-07 NOTE — Patient Instructions (Signed)
We will refer you to our lung cancer screening program ? ?Try stiolto inhaler 2 puffs daily ?- if you notice improvement from the inhaler, we ill send in a prescription ? ?Recommend looking into a water aerobics or similar type of exercise class that will be easier on your hips and knees.  ? ?Consider getting a walker to alleviate the discomfort on the hips/knees. ? ?We will schedule you for pulmonary function tests at follow up in 6 weeks ?

## 2021-10-08 ENCOUNTER — Encounter: Payer: Self-pay | Admitting: Pulmonary Disease

## 2021-10-14 DIAGNOSIS — E1165 Type 2 diabetes mellitus with hyperglycemia: Secondary | ICD-10-CM | POA: Diagnosis not present

## 2021-10-14 DIAGNOSIS — G894 Chronic pain syndrome: Secondary | ICD-10-CM | POA: Diagnosis not present

## 2021-10-14 DIAGNOSIS — L304 Erythema intertrigo: Secondary | ICD-10-CM | POA: Diagnosis not present

## 2021-10-14 DIAGNOSIS — M25511 Pain in right shoulder: Secondary | ICD-10-CM | POA: Diagnosis not present

## 2021-10-14 DIAGNOSIS — R69 Illness, unspecified: Secondary | ICD-10-CM | POA: Diagnosis not present

## 2021-10-14 DIAGNOSIS — M25512 Pain in left shoulder: Secondary | ICD-10-CM | POA: Diagnosis not present

## 2021-11-17 DIAGNOSIS — R252 Cramp and spasm: Secondary | ICD-10-CM | POA: Diagnosis not present

## 2021-11-17 DIAGNOSIS — R202 Paresthesia of skin: Secondary | ICD-10-CM | POA: Diagnosis not present

## 2021-11-22 ENCOUNTER — Ambulatory Visit: Payer: Medicare HMO | Admitting: Pulmonary Disease

## 2021-11-24 DIAGNOSIS — M25511 Pain in right shoulder: Secondary | ICD-10-CM | POA: Diagnosis not present

## 2021-11-24 DIAGNOSIS — M25512 Pain in left shoulder: Secondary | ICD-10-CM | POA: Diagnosis not present

## 2021-12-28 DIAGNOSIS — M25562 Pain in left knee: Secondary | ICD-10-CM | POA: Diagnosis not present

## 2021-12-28 DIAGNOSIS — M25561 Pain in right knee: Secondary | ICD-10-CM | POA: Diagnosis not present

## 2021-12-28 DIAGNOSIS — S299XXD Unspecified injury of thorax, subsequent encounter: Secondary | ICD-10-CM | POA: Diagnosis not present

## 2021-12-28 DIAGNOSIS — M5451 Vertebrogenic low back pain: Secondary | ICD-10-CM | POA: Diagnosis not present

## 2022-01-04 ENCOUNTER — Encounter: Payer: Self-pay | Admitting: Pulmonary Disease

## 2022-01-04 ENCOUNTER — Ambulatory Visit: Payer: Medicare HMO | Admitting: Pulmonary Disease

## 2022-01-04 ENCOUNTER — Ambulatory Visit (INDEPENDENT_AMBULATORY_CARE_PROVIDER_SITE_OTHER): Payer: Medicare HMO | Admitting: Pulmonary Disease

## 2022-01-04 VITALS — BP 124/82 | HR 81 | Ht 64.5 in | Wt 209.8 lb

## 2022-01-04 DIAGNOSIS — R0683 Snoring: Secondary | ICD-10-CM

## 2022-01-04 DIAGNOSIS — R0602 Shortness of breath: Secondary | ICD-10-CM | POA: Diagnosis not present

## 2022-01-04 DIAGNOSIS — R7989 Other specified abnormal findings of blood chemistry: Secondary | ICD-10-CM | POA: Diagnosis not present

## 2022-01-04 DIAGNOSIS — Z87891 Personal history of nicotine dependence: Secondary | ICD-10-CM | POA: Diagnosis not present

## 2022-01-04 LAB — PULMONARY FUNCTION TEST
DL/VA % pred: 120 %
DL/VA: 4.93 ml/min/mmHg/L
DLCO cor % pred: 102 %
DLCO cor: 20.15 ml/min/mmHg
DLCO unc % pred: 102 %
DLCO unc: 20.15 ml/min/mmHg
FEF 25-75 Post: 1.49 L/sec
FEF 25-75 Pre: 1.62 L/sec
FEF2575-%Change-Post: -8 %
FEF2575-%Pred-Post: 83 %
FEF2575-%Pred-Pre: 90 %
FEV1-%Change-Post: 0 %
FEV1-%Pred-Post: 91 %
FEV1-%Pred-Pre: 92 %
FEV1-Post: 2.03 L
FEV1-Pre: 2.04 L
FEV1FVC-%Change-Post: 1 %
FEV1FVC-%Pred-Pre: 100 %
FEV6-%Change-Post: -1 %
FEV6-%Pred-Post: 94 %
FEV6-%Pred-Pre: 96 %
FEV6-Post: 2.66 L
FEV6-Pre: 2.71 L
FEV6FVC-%Pred-Post: 104 %
FEV6FVC-%Pred-Pre: 104 %
FVC-%Change-Post: -1 %
FVC-%Pred-Post: 90 %
FVC-%Pred-Pre: 92 %
FVC-Post: 2.66 L
FVC-Pre: 2.71 L
Post FEV1/FVC ratio: 76 %
Post FEV6/FVC ratio: 100 %
Pre FEV1/FVC ratio: 75 %
Pre FEV6/FVC Ratio: 100 %
RV % pred: 99 %
RV: 2.27 L
TLC % pred: 97 %
TLC: 5.02 L

## 2022-01-04 NOTE — Progress Notes (Signed)
ok  Synopsis: Referred in March 2023 for Shortness of breath by Garth BignessKathryn Timberlake, MD  Subjective:   PATIENT ID: Jamie Gilmore GENDER: female DOB: 01/28/1946, MRN: [id number]  HPI  Chief Complaint  Patient presents with   Follow-up    F/U after PFT. States her SOB has increased since last visit. Denies any coughing, wheezing or chest pain.    Jamie Gilmore is a 76 year old woman, daily smoker with GERD, hypertension and history of DVT who is referred to pulmonary clinic for shortness of breath.   She was given sample of stiolto at last visit but did not use it. She reports increasing dyspnea. She has no cough, wheezing or chest discomfort.  PFTs today are within normal limits.   Initial OV 10/07/21 She reports progressive shortness of breath over many years. This year she has noticed it hindering her from basic activities such as cleaning her house and cooking. She becomes to short of breath to cook or clean and has to take frequent breaks. She denies any cough or wheezing.  She does have sinus congestion and postnasal drainage.  She reports she is constantly clearing her throat.  She denies any lower extremity edema, chest pain or paroxysmal nocturnal dyspnea.  She has significant musculoskeletal issues with bilateral hip and knee pain.  She had left hip and knee replacements previously.  She was walking up to 1.5 miles per day until last year when she had to stop due to the joint pain.  She has gained a significant amount of weight over recent years.  Records and labs reviewed from her primary care team.  She had a serum bicarb level of 35 mmol/L on 09/16/2021.  She quit smoking in 2018.  She has over a 20 pack year smoking history.  Her father had heart disease and asthma.  Past Medical History:  Diagnosis Date   Acute blood loss anemia 08/01/2012   Arthritis    Depression    Elevated blood sugar    "borderline diabetes"   GERD (gastroesophageal reflux disease)     Headache(784.0)    history migraines   Hypertension    Peripheral vascular disease (HCC) 05/2010   DVT post op knee replacement   Shortness of breath    "all my life"- rarely use     Family History  Problem Relation Age of Onset   Diabetes Mother    Parkinson's disease Mother    Asthma Father    Tuberculosis Father      Social History   Socioeconomic History   Marital status: Divorced    Spouse name: Not on file   Number of children: Not on file   Years of education: Not on file   Highest education level: Not on file  Occupational History   Not on file  Tobacco Use   Smoking status: Former    Packs/day: 2.00    Types: Cigarettes    Quit date: 10/30/2016    Years since quitting: 5.1   Smokeless tobacco: Never  Substance and Sexual Activity   Alcohol use: Yes    Comment: 1-2 glasses white wine   Drug use: No   Sexual activity: Not on file  Other Topics Concern   Not on file  Social History Narrative   Not on file   Social Determinants of Health   Financial Resource Strain: Not on file  Food Insecurity: Not on file  Transportation Needs: Not on file  Physical Activity: Not on file  Stress:  Not on file  Social Connections: Not on file  Intimate Partner Violence: Not on file     Allergies  Allergen Reactions   Lisinopril Cough     Outpatient Medications Prior to Visit  Medication Sig Dispense Refill   albuterol (PROVENTIL HFA;VENTOLIN HFA) 108 (90 BASE) MCG/ACT inhaler Inhale 2 puffs into the lungs every 6 (six) hours as needed. Wheezing and shortness of breath     ALPRAZolam (XANAX) 0.25 MG tablet Take 1 tablet by mouth daily as needed.     aspirin 325 MG tablet 1 tablet     atorvastatin (LIPITOR) 20 MG tablet Take 1 tablet by mouth daily.     FLUoxetine (PROZAC) 40 MG capsule      gabapentin (NEURONTIN) 300 MG capsule Take 300 mg by mouth at bedtime.     hydrochlorothiazide (HYDRODIURIL) 25 MG tablet Take 25 mg by mouth daily before breakfast.       metFORMIN (GLUCOPHAGE-XR) 500 MG 24 hr tablet Take 1 tablet by mouth daily.     pantoprazole (PROTONIX) 40 MG tablet Take 1 tablet by mouth daily.     Tiotropium Bromide-Olodaterol (STIOLTO RESPIMAT) 2.5-2.5 MCG/ACT AERS Inhale 2 puffs into the lungs daily. 8 g 0   traMADol (ULTRAM) 50 MG tablet Take 50 mg by mouth every 6 (six) hours as needed for moderate pain.     traZODone (DESYREL) 50 MG tablet Take 1 tablet (50 mg total) by mouth at bedtime. 30 tablet 0   metoprolol tartrate (LOPRESSOR) 50 MG tablet Take 1 tablet (50 mg total) by mouth once for 1 dose. PLEASE TAKE METOPROLOL 2  HOURS PRIOR TO CTA SCAN. 1 tablet 0   No facility-administered medications prior to visit.   Review of Systems  Constitutional:  Negative for chills, fever, malaise/fatigue and weight loss.  HENT:  Negative for congestion, sinus pain and sore throat.   Eyes: Negative.   Respiratory:  Positive for shortness of breath. Negative for cough, hemoptysis, sputum production and wheezing.   Cardiovascular:  Negative for chest pain, palpitations, orthopnea, claudication and leg swelling.  Gastrointestinal:  Negative for abdominal pain, heartburn, nausea and vomiting.  Genitourinary: Negative.   Musculoskeletal:  Negative for joint pain and myalgias.  Skin:  Negative for rash.  Neurological:  Negative for weakness.  Endo/Heme/Allergies: Negative.   Psychiatric/Behavioral: Negative.     Objective:   Vitals:   01/04/22 1604  BP: 124/82  Pulse: 81  SpO2: 100%  Weight: 209 lb 12.8 oz (95.2 kg)  Height: 5' 4.5" (1.638 m)   Physical Exam Constitutional:      General: She is not in acute distress.    Appearance: She is obese. She is not ill-appearing.  HENT:     Head: Normocephalic and atraumatic.  Eyes:     General: No scleral icterus.    Conjunctiva/sclera: Conjunctivae normal.  Cardiovascular:     Rate and Rhythm: Normal rate and regular rhythm.     Pulses: Normal pulses.     Heart sounds: Normal heart  sounds. No murmur heard. Pulmonary:     Effort: Pulmonary effort is normal.     Breath sounds: Normal breath sounds. No wheezing, rhonchi or rales.  Musculoskeletal:     Right lower leg: No edema.     Left lower leg: No edema.  Skin:    General: Skin is warm and dry.  Neurological:     General: No focal deficit present.     Mental Status: She is alert.  Psychiatric:  Mood and Affect: Mood normal.        Behavior: Behavior normal.        Thought Content: Thought content normal.        Judgment: Judgment normal.   CBC    Component Value Date/Time   WBC 6.6 04/05/2015 0740   RBC 3.99 04/05/2015 0740   HGB 12.5 04/05/2015 0740   HCT 37.1 04/05/2015 0740   PLT 314 04/05/2015 0740   MCV 93.0 04/05/2015 0740   MCH 31.3 04/05/2015 0740   MCHC 33.7 04/05/2015 0740   RDW 16.2 (H) 04/05/2015 0740   LYMPHSABS 2.4 04/05/2015 0740   MONOABS 0.6 04/05/2015 0740   EOSABS 0.1 04/05/2015 0740   BASOSABS 0.0 04/05/2015 0740      Latest Ref Rng & Units 03/11/2021    2:21 PM 07/16/2020   12:00 PM 04/06/2015    5:00 AM  BMP  Glucose 65 - 99 mg/dL 77   96   93    BUN 8 - 27 mg/dL Creatinine 0.57 - 1.00 mg/dL 1.61   0.96   0.45    BUN/Creat Ratio 12 - Sodium 134 - 144 mmol/L 140   140   142    Potassium 3.5 - 5.2 mmol/L 4.6   4.8   3.6    Chloride 96 - 106 mmol/L 100   100   110    CO2 20 - 29 mmol/L Calcium 8.7 - 10.3 mg/dL 9.3   9.0   8.2     Chest imaging: CXR 05/25/21 Mild cardiomegaly. Both lungs are clear. The visualized skeletal structures are unremarkable.  PFT:    Latest Ref Rng & Units 01/04/2022    2:43 PM  PFT Results  FVC-Pre L 2.71    FVC-Predicted Pre % 92    FVC-Post L 2.66    FVC-Predicted Post % 90    Pre FEV1/FVC % % 75    Post FEV1/FCV % % 76    FEV1-Pre L 2.04    FEV1-Predicted Pre % 92    FEV1-Post L 2.03    DLCO uncorrected ml/min/mmHg 20.15    DLCO UNC% % 102    DLCO corrected ml/min/mmHg 20.15     DLCO COR %Predicted % 102    DLVA Predicted % 120    TLC L 5.02    TLC % Predicted % 97    RV % Predicted % 99    PFTs 2023: Within normal limits  Labs:  Path:  Echo 03/09/21: EF 60-65%. Normal diastolic function. RV size and function are normal.   Heart Catheterization:  Assessment & Plan:   Shortness of breath - Plan: Ambulatory referral to Cardiology  Former smoker  Snoring - Plan: Home sleep test  High serum bicarbonate - Plan: Home sleep test  Discussion: Jamie Gilmore is a 76 year old woman, daily smoker with GERD, hypertension and history of DVT who returns to pulmonary clinic for shortness of breath.  Her shortness of breath is multifactorial in setting of deconditioning and obesity.   Her pulmonary function is within normal limits. Encouraged her to try the stiolto inhaler and monitor for any improvement.   We will refer her to cardiology for further assessment of her dyspnea.  She has elevated serumb bicarb level concerning for history of obstructive sleep apnea along with history of  snoring. Home sleep study ordered.  Follow up in 6 months.  Jamie Comas, MD [facility] Office: [phone]    Current Outpatient Medications:    albuterol (PROVENTIL HFA;VENTOLIN HFA) 108 (90 BASE) MCG/ACT inhaler, Inhale 2 puffs into the lungs every 6 (six) hours as needed. Wheezing and shortness of breath, Disp: , Rfl:    ALPRAZolam (XANAX) 0.25 MG tablet, Take 1 tablet by mouth daily as needed., Disp: , Rfl:    aspirin 325 MG tablet, 1 tablet, Disp: , Rfl:    atorvastatin (LIPITOR) 20 MG tablet, Take 1 tablet by mouth daily., Disp: , Rfl:    FLUoxetine (PROZAC) 40 MG capsule, , Disp: , Rfl:    gabapentin (NEURONTIN) 300 MG capsule, Take 300 mg by mouth at bedtime., Disp: , Rfl:    hydrochlorothiazide (HYDRODIURIL) 25 MG tablet, Take 25 mg by mouth daily before breakfast. , Disp: , Rfl:    metFORMIN (GLUCOPHAGE-XR) 500 MG 24 hr tablet, Take 1  tablet by mouth daily., Disp: , Rfl:    pantoprazole (PROTONIX) 40 MG tablet, Take 1 tablet by mouth daily., Disp: , Rfl:    Tiotropium Bromide-Olodaterol (STIOLTO RESPIMAT) 2.5-2.5 MCG/ACT AERS, Inhale 2 puffs into the lungs daily., Disp: 8 g, Rfl: 0   traMADol (ULTRAM) 50 MG tablet, Take 50 mg by mouth every 6 (six) hours as needed for moderate pain., Disp: , Rfl:    traZODone (DESYREL) 50 MG tablet, Take 1 tablet (50 mg total) by mouth at bedtime., Disp: 30 tablet, Rfl: 0

## 2022-01-04 NOTE — Patient Instructions (Signed)
Full PFT Performed Today  

## 2022-01-04 NOTE — Patient Instructions (Addendum)
Try stiolto 2 puffs daily  If you notice improvement to your breathing please let us know and we will send in a prescription to continue on this medication  We will refer you to cardiology for further assessment of the shortness of breath  We will check a home sleep study to evaluate for obstructive sleep apnea.   Follow up in 6 months

## 2022-01-04 NOTE — Progress Notes (Signed)
Full PFT Performed Today  

## 2022-01-08 ENCOUNTER — Encounter: Payer: Self-pay | Admitting: Pulmonary Disease

## 2022-02-22 ENCOUNTER — Emergency Department (HOSPITAL_BASED_OUTPATIENT_CLINIC_OR_DEPARTMENT_OTHER)
Admission: EM | Admit: 2022-02-22 | Discharge: 2022-02-22 | Disposition: A | Discharge status: Home / Self Care | Payer: Medicare HMO | Attending: Emergency Medicine | Admitting: Emergency Medicine

## 2022-02-22 ENCOUNTER — Encounter (HOSPITAL_BASED_OUTPATIENT_CLINIC_OR_DEPARTMENT_OTHER): Payer: Self-pay | Admitting: Obstetrics and Gynecology

## 2022-02-22 ENCOUNTER — Other Ambulatory Visit: Payer: Self-pay

## 2022-02-22 DIAGNOSIS — R252 Cramp and spasm: Secondary | ICD-10-CM | POA: Diagnosis not present

## 2022-02-22 DIAGNOSIS — E1165 Type 2 diabetes mellitus with hyperglycemia: Secondary | ICD-10-CM | POA: Diagnosis not present

## 2022-02-22 DIAGNOSIS — E876 Hypokalemia: Secondary | ICD-10-CM | POA: Diagnosis not present

## 2022-02-22 DIAGNOSIS — Z7982 Long term (current) use of aspirin: Secondary | ICD-10-CM | POA: Insufficient documentation

## 2022-02-22 DIAGNOSIS — I10 Essential (primary) hypertension: Secondary | ICD-10-CM | POA: Diagnosis not present

## 2022-02-22 DIAGNOSIS — R799 Abnormal finding of blood chemistry, unspecified: Secondary | ICD-10-CM | POA: Diagnosis present

## 2022-02-22 DIAGNOSIS — Z79899 Other long term (current) drug therapy: Secondary | ICD-10-CM | POA: Diagnosis not present

## 2022-02-22 DIAGNOSIS — R202 Paresthesia of skin: Secondary | ICD-10-CM | POA: Diagnosis not present

## 2022-02-22 LAB — BASIC METABOLIC PANEL
Anion gap: 11 (ref 5–15)
BUN: 10 mg/dL (ref 8–23)
CO2: 30 mmol/L (ref 22–32)
Calcium: 7.9 mg/dL — ABNORMAL LOW (ref 8.9–10.3)
Chloride: 101 mmol/L (ref 98–111)
Creatinine, Ser: 0.68 mg/dL (ref 0.44–1.00)
GFR, Estimated: >60 mL/min (ref >60)
Glucose, Bld: 77 mg/dL (ref 70–99)
Potassium: 3 mmol/L — ABNORMAL LOW (ref 3.5–5.1)
Sodium: 142 mmol/L (ref 135–145)

## 2022-02-22 LAB — COMPREHENSIVE METABOLIC PANEL
ALT: 23 U/L (ref 0–44)
AST: 20 U/L (ref 15–41)
Albumin: 4 g/dL (ref 3.5–5.0)
Alkaline Phosphatase: 56 U/L (ref 38–126)
Anion gap: 12 (ref 5–15)
BUN: 11 mg/dL (ref 8–23)
CO2: 32 mmol/L (ref 22–32)
Calcium: 7.7 mg/dL — ABNORMAL LOW (ref 8.9–10.3)
Chloride: 96 mmol/L — ABNORMAL LOW (ref 98–111)
Creatinine, Ser: 0.71 mg/dL (ref 0.44–1.00)
GFR, Estimated: >60 mL/min (ref >60)
Glucose, Bld: 84 mg/dL (ref 70–99)
Potassium: 2.7 mmol/L — CL (ref 3.5–5.1)
Sodium: 140 mmol/L (ref 135–145)
Total Bilirubin: 0.5 mg/dL (ref 0.3–1.2)
Total Protein: 6.9 g/dL (ref 6.5–8.1)

## 2022-02-22 LAB — CBC
HCT: 36.8 % (ref 36.0–46.0)
Hemoglobin: 12.5 g/dL (ref 12.0–15.0)
MCH: 29.3 pg (ref 26.0–34.0)
MCHC: 34 g/dL (ref 30.0–36.0)
MCV: 86.2 fL (ref 80.0–100.0)
Platelets: 321 10*3/uL (ref 150–400)
RBC: 4.27 MIL/uL (ref 3.87–5.11)
RDW: 15.5 % (ref 11.5–15.5)
WBC: 6 10*3/uL (ref 4.0–10.5)
nRBC: 0 % (ref 0.0–0.2)

## 2022-02-22 LAB — MAGNESIUM
Magnesium: 1.1 mg/dL — ABNORMAL LOW (ref 1.7–2.4)
Magnesium: 1.8 mg/dL (ref 1.7–2.4)

## 2022-02-22 LAB — T4, FREE: Free T4: 1 ng/dL (ref 0.61–1.12)

## 2022-02-22 LAB — TSH: TSH: 1.293 u[IU]/mL (ref 0.350–4.500)

## 2022-02-22 MED ORDER — POTASSIUM CHLORIDE 10 MEQ/100ML IV SOLN
10.0000 meq | INTRAVENOUS | Status: AC
  Administered 2022-02-22 (×4): 10 meq via INTRAVENOUS
  Filled 2022-02-22 (×4): qty 100

## 2022-02-22 MED ORDER — POTASSIUM CHLORIDE CRYS ER 20 MEQ PO TBCR: 20.0000 meq | EXTENDED_RELEASE_TABLET | Freq: Two times a day (BID) | ORAL | 0 refills | Status: DC

## 2022-02-22 MED ORDER — CALCIUM CARBONATE ANTACID 500 MG PO CHEW: 1.0000 | CHEWABLE_TABLET | Freq: Every day | ORAL | 0 refills | Status: AC

## 2022-02-22 MED ORDER — MAGNESIUM OXIDE -MG SUPPLEMENT 400 (240 MG) MG PO TABS
400.0000 mg | ORAL_TABLET | Freq: Once | ORAL | Status: AC
  Administered 2022-02-22: 400 mg via ORAL
  Filled 2022-02-22: qty 1

## 2022-02-22 MED ORDER — MAGNESIUM SULFATE 2 GM/50ML IV SOLN
2.0000 g | Freq: Once | INTRAVENOUS | Status: AC
  Administered 2022-02-22: 2 g via INTRAVENOUS
  Filled 2022-02-22: qty 50

## 2022-02-22 MED ORDER — SODIUM CHLORIDE 0.9 % IV SOLN: 1.0000 g | Freq: Once | INTRAVENOUS | Status: DC

## 2022-02-22 MED ORDER — POTASSIUM CHLORIDE CRYS ER 20 MEQ PO TBCR
40.0000 meq | EXTENDED_RELEASE_TABLET | Freq: Once | ORAL | Status: AC
  Administered 2022-02-22: 40 meq via ORAL
  Filled 2022-02-22: qty 2

## 2022-02-22 MED ORDER — CALCIUM GLUCONATE-NACL 1-0.675 GM/50ML-% IV SOLN
1.0000 g | Freq: Once | INTRAVENOUS | Status: AC
  Administered 2022-02-22: 1000 mg via INTRAVENOUS

## 2022-02-22 NOTE — ED Notes (Signed)
MD notified that all meds are completed, updated pt.

## 2022-02-22 NOTE — ED Triage Notes (Signed)
Patient sent to the ER from [facility] for low calcium. Patient reports her calcium was low per her PCP. Patient sent here for evaluation of her tingling extremities and electrolyte replacement

## 2022-02-22 NOTE — ED Provider Notes (Signed)
[facility] EMERGENCY DEPT Provider Note   CSN: [id number] Arrival date & time: 02/22/22  2703     History  Chief Complaint  Patient presents with   Abnormal Lab    Jamie Gilmore is a 76 y.o. female.   Abnormal Lab   76 year old female with medical history significant for HTN on hydrochlorothiazide, depression, GERD, PVD who presents to the emergency department with hypocalcemia.  The patient states that she has had occasional spasms in her cheeks and in her hands bilaterally.  She takes hydrochlorothiazide for hypertension.  She states that occasionally she has been having muscle cramping and aches and has been feeling generalized fatigue.  She denies any chest pain or shortness of breath.  She denies any abdominal pain, nausea and vomiting.  She did have episodes of diarrhea roughly 1 week ago which is since resolved.  Home Medications Prior to Admission medications   Medication Sig Start Date End Date Taking? Authorizing Provider  calcium carbonate (TUMS) 500 MG chewable tablet Chew 1 tablet (200 mg of elemental calcium total) by mouth daily for 14 days. 02/22/22 03/08/22 Yes Ernie Avena, MD  potassium chloride SA (KLOR-CON M) 20 MEQ tablet Take 1 tablet (20 mEq total) by mouth 2 (two) times daily for 5 days. 02/22/22 02/27/22 Yes Ernie Avena, MD  albuterol (PROVENTIL HFA;VENTOLIN HFA) 108 (90 BASE) MCG/ACT inhaler Inhale 2 puffs into the lungs every 6 (six) hours as needed. Wheezing and shortness of breath    [provider]  ALPRAZolam (XANAX) 0.25 MG tablet Take 1 tablet by mouth daily as needed. 09/29/21   [provider]  aspirin 325 MG tablet 1 tablet    [provider]  atorvastatin (LIPITOR) 20 MG tablet Take 1 tablet by mouth daily.    [provider]  FLUoxetine (PROZAC) 40 MG capsule  02/26/15   [provider]  gabapentin (NEURONTIN) 300 MG capsule Take 300 mg by mouth at bedtime.    [provider]   hydrochlorothiazide (HYDRODIURIL) 25 MG tablet Take 25 mg by mouth daily before breakfast.  05/24/12   [provider]  metFORMIN (GLUCOPHAGE-XR) 500 MG 24 hr tablet Take 1 tablet by mouth daily. 09/16/21   [provider]  pantoprazole (PROTONIX) 40 MG tablet Take 1 tablet by mouth daily.    [provider]  Tiotropium Bromide-Olodaterol (STIOLTO RESPIMAT) 2.5-2.5 MCG/ACT AERS Inhale 2 puffs into the lungs daily. 10/07/21   Martina Sinner, MD  traMADol (ULTRAM) 50 MG tablet Take 50 mg by mouth every 6 (six) hours as needed for moderate pain.    [provider]  traZODone (DESYREL) 50 MG tablet Take 1 tablet (50 mg total) by mouth at bedtime. 04/06/15   Rama, Maryruth Bun, MD      Allergies    Lisinopril    Review of Systems   Review of Systems  Constitutional:  Positive for fatigue.  Neurological:  Positive for tremors.  All other systems reviewed and are negative.   Physical Exam Updated Vital Signs BP 138/75   Pulse 63   Temp 98 F (36.7 C) (Oral)   Resp 14   SpO2 97%  Physical Exam Vitals and nursing note reviewed.  Constitutional:      General: She is not in acute distress.    Appearance: She is well-developed.  HENT:     Head: Normocephalic and atraumatic.  Eyes:     Conjunctiva/sclera: Conjunctivae normal.  Cardiovascular:     Rate and Rhythm: Normal  rate and regular rhythm.  Pulmonary:     Effort: Pulmonary effort is normal. No respiratory distress.     Breath sounds: Normal breath sounds.  Abdominal:     Palpations: Abdomen is soft.     Tenderness: There is no abdominal tenderness.  Musculoskeletal:        General: No swelling.     Cervical back: Neck supple.     Comments: Positive Chvostek and Trousseau signs  Skin:    General: Skin is warm and dry.     Capillary Refill: Capillary refill takes less than 2 seconds.  Neurological:     Mental Status: She is alert.     Comments: MENTAL STATUS EXAM:    Orientation: Alert  and oriented to person, place and time.  Memory: Cooperative, follows commands well.  Language: Speech is clear and language is normal.   CRANIAL NERVES:    CN 2 (Optic): Visual fields intact to confrontation.  CN 3,4,6 (EOM): Pupils equal and reactive to light. Full extraocular eye movement without nystagmus.  CN 5 (Trigeminal): Facial sensation is normal, no weakness of masticatory muscles.  CN 7 (Facial): No facial weakness or asymmetry.  CN 8 (Auditory): Auditory acuity grossly normal.  CN 9,10 (Glossophar): The uvula is midline, the palate elevates symmetrically.  CN 11 (spinal access): Normal sternocleidomastoid and trapezius strength.  CN 12 (Hypoglossal): The tongue is midline. No atrophy or fasciculations.Marland Kitchen   MOTOR:  Muscle Strength: 5/5RUE, 5/5LUE, 5/5RLE, 5/5LLE.   COORDINATION:   Intact finger-to-nose, no tremor.   SENSATION:   Intact to light touch all four extremities.    Psychiatric:        Mood and Affect: Mood normal.     ED Results / Procedures / Treatments   Labs (all labs ordered are listed, but only abnormal results are displayed) Labs Reviewed  COMPREHENSIVE METABOLIC PANEL - Abnormal; Notable for the following components:      Result Value   Potassium 2.7 (*)    Chloride 96 (*)    Calcium 7.7 (*)    All other components within normal limits  MAGNESIUM - Abnormal; Notable for the following components:   Magnesium 1.1 (*)    All other components within normal limits  BASIC METABOLIC PANEL - Abnormal; Notable for the following components:   Potassium 3.0 (*)    Calcium 7.9 (*)    All other components within normal limits  CBC  TSH  T4, FREE  MAGNESIUM    EKG EKG Interpretation  Date/Time:  Wednesday February 22 2022 16:51:23 EDT Ventricular Rate:  65 PR Interval:  164 QRS Duration: 102 QT Interval:  470 QTC Calculation: 488 R Axis:   19 Text Interpretation: Normal sinus rhythm Possible Anterior infarct , age undetermined Abnormal ECG When  compared with ECG of 05-Apr-2015 09:21, Borderline criteria for Anterior infarct are now Present Confirmed by Ernie Avena ([id number]) on 02/22/2022 7:32:40 PM  Radiology No results found.  Procedures Procedures    Medications Ordered in ED Medications  potassium chloride SA (KLOR-CON M) CR tablet 40 mEq (40 mEq Oral Given 02/22/22 1801)  potassium chloride 10 mEq in 100 mL IVPB (0 mEq Intravenous Stopped 02/22/22 2227)  magnesium sulfate IVPB 2 g 50 mL (0 g Intravenous Stopped 02/22/22 2108)  magnesium oxide (MAG-OX) tablet 400 mg (400 mg Oral Given 02/22/22 1921)  calcium gluconate 1 g/ 50 mL sodium chloride IVPB (0 mg Intravenous Stopped 02/22/22 2123)    ED Course/ Medical Decision Making/ A&P Clinical Course  as of 02/23/22 1350  Wed Feb 22, 2022  1745 Potassium(!!): 2.7 [JL]  2246 Magnesium(!): 1.1 [JL]  2246 Calcium(!): 7.7 [JL]    Clinical Course User Index [JL] Ernie Avena, MD                           Medical Decision Making Amount and/or Complexity of Data Reviewed Labs: ordered. Decision-making details documented in ED Course.  Risk OTC drugs. Prescription drug management.    76 year old female with medical history significant for HTN on hydrochlorothiazide, depression, GERD, PVD who presents to the emergency department with hypocalcemia.  The patient states that she has had occasional spasms in her cheeks and in her hands bilaterally.  She takes hydrochlorothiazide for hypertension.  She states that occasionally she has been having muscle cramping and aches and has been feeling generalized fatigue.  She denies any chest pain or shortness of breath.  She denies any abdominal pain, nausea and vomiting.  She did have episodes of diarrhea roughly 1 week ago which is since resolved.  On arrival, the patient was vitally stable.  Physical exam significant for normal neurologic exam, positive Chvostek and Trousseau signs.  Patient laboratory work-up significant for  hypomagnesemia to 1.1, hypokalemia 2.7, hypocalcemia to 7.7.  The patient's electrolytes were replenished orally and IV.  Patient was advised to stop hydrochlorothiazide as that could be contributing to her hypokalemia in addition to her diarrheal illness last week.  Repeat mag and BMP was pending.  Repeat BMP reveals a potassium improved to 3.0, magnesium improved to 1.8, persistent mild hypocalcemia to 7.9.  Discussed admission for observation and continued upper electrolyte repletion versus discharge home.  The patient strongly requested to be discharged.  Advised that the patient start calcium supplementation and potassium supplementation and to follow-up with her PCP in 3 days for recheck of her laboratory work-up.  Stable at time of discharge.    Final Clinical Impression(s) / ED Diagnoses Final diagnoses:  Hypokalemia  Hypomagnesemia  Hypocalcemia    Rx / DC Orders ED Discharge Orders          Ordered    potassium chloride SA (KLOR-CON M) 20 MEQ tablet  2 times daily        02/22/22 2331    calcium carbonate (TUMS) 500 MG chewable tablet  Daily        02/22/22 2332              Ernie Avena, MD 02/23/22 1350

## 2022-02-22 NOTE — Discharge Instructions (Addendum)
Hold your hydrochlorothiazide and follow-up with your PCP.  We will place you on potassium supplementation for the next 5 days. Take TUMS as well. Will need a recheck of your labs

## 2022-02-28 DIAGNOSIS — R5383 Other fatigue: Secondary | ICD-10-CM | POA: Diagnosis not present

## 2022-02-28 DIAGNOSIS — E876 Hypokalemia: Secondary | ICD-10-CM | POA: Diagnosis not present

## 2022-02-28 DIAGNOSIS — E559 Vitamin D deficiency, unspecified: Secondary | ICD-10-CM | POA: Diagnosis not present

## 2022-03-16 DIAGNOSIS — R5383 Other fatigue: Secondary | ICD-10-CM | POA: Diagnosis not present

## 2022-03-16 DIAGNOSIS — E1165 Type 2 diabetes mellitus with hyperglycemia: Secondary | ICD-10-CM | POA: Diagnosis not present

## 2022-03-16 DIAGNOSIS — M2550 Pain in unspecified joint: Secondary | ICD-10-CM | POA: Diagnosis not present

## 2022-03-16 DIAGNOSIS — E559 Vitamin D deficiency, unspecified: Secondary | ICD-10-CM | POA: Diagnosis not present

## 2022-03-16 DIAGNOSIS — I10 Essential (primary) hypertension: Secondary | ICD-10-CM | POA: Diagnosis not present

## 2022-04-18 IMAGING — CR DG CHEST 2V
2 series · 2 of 2 positions shown · non-contrast
Comparison: 04/05/2015

CLINICAL DATA: Shortness of breath, cough

EXAM:
CHEST - 2 VIEW

[w chest pa]
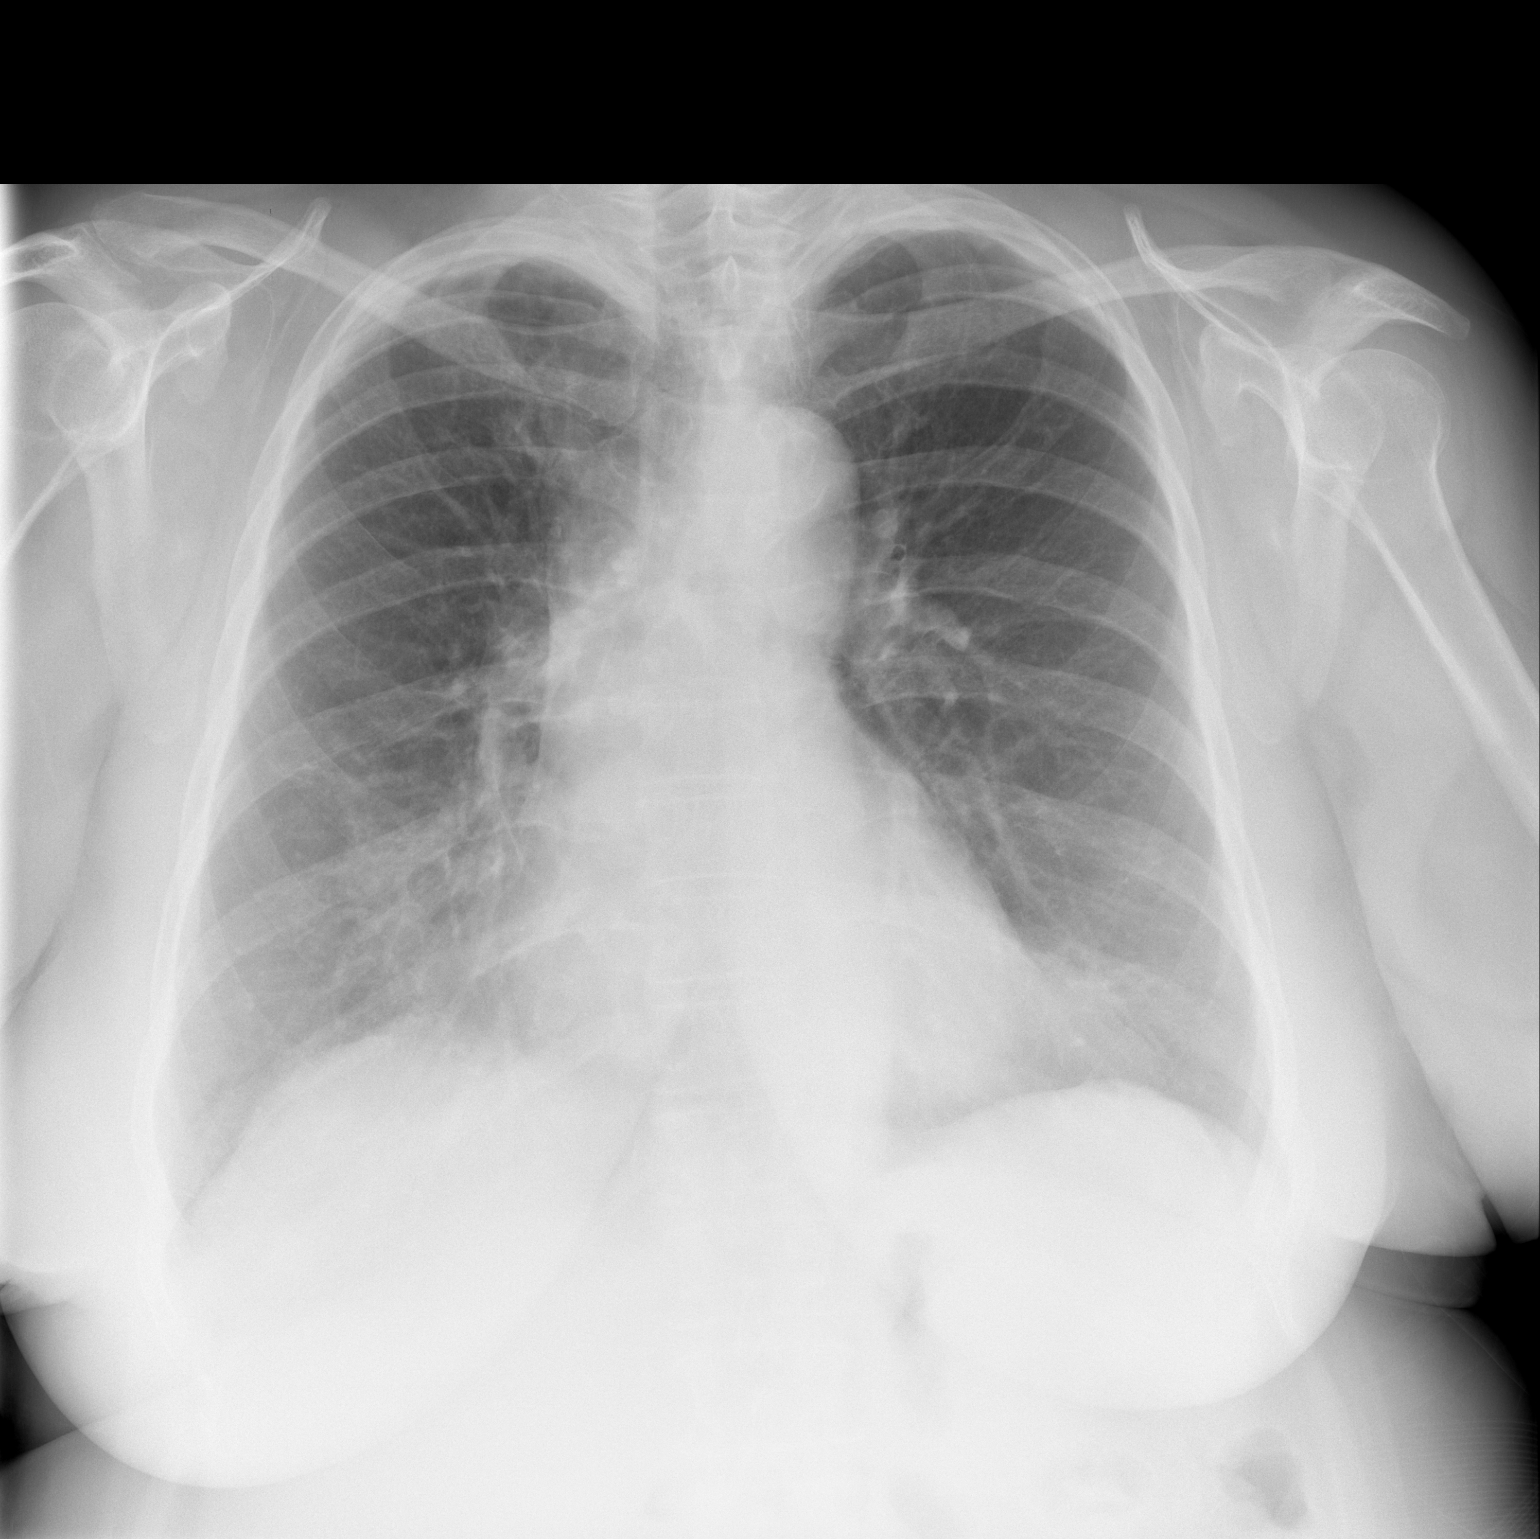

[w chest lat]
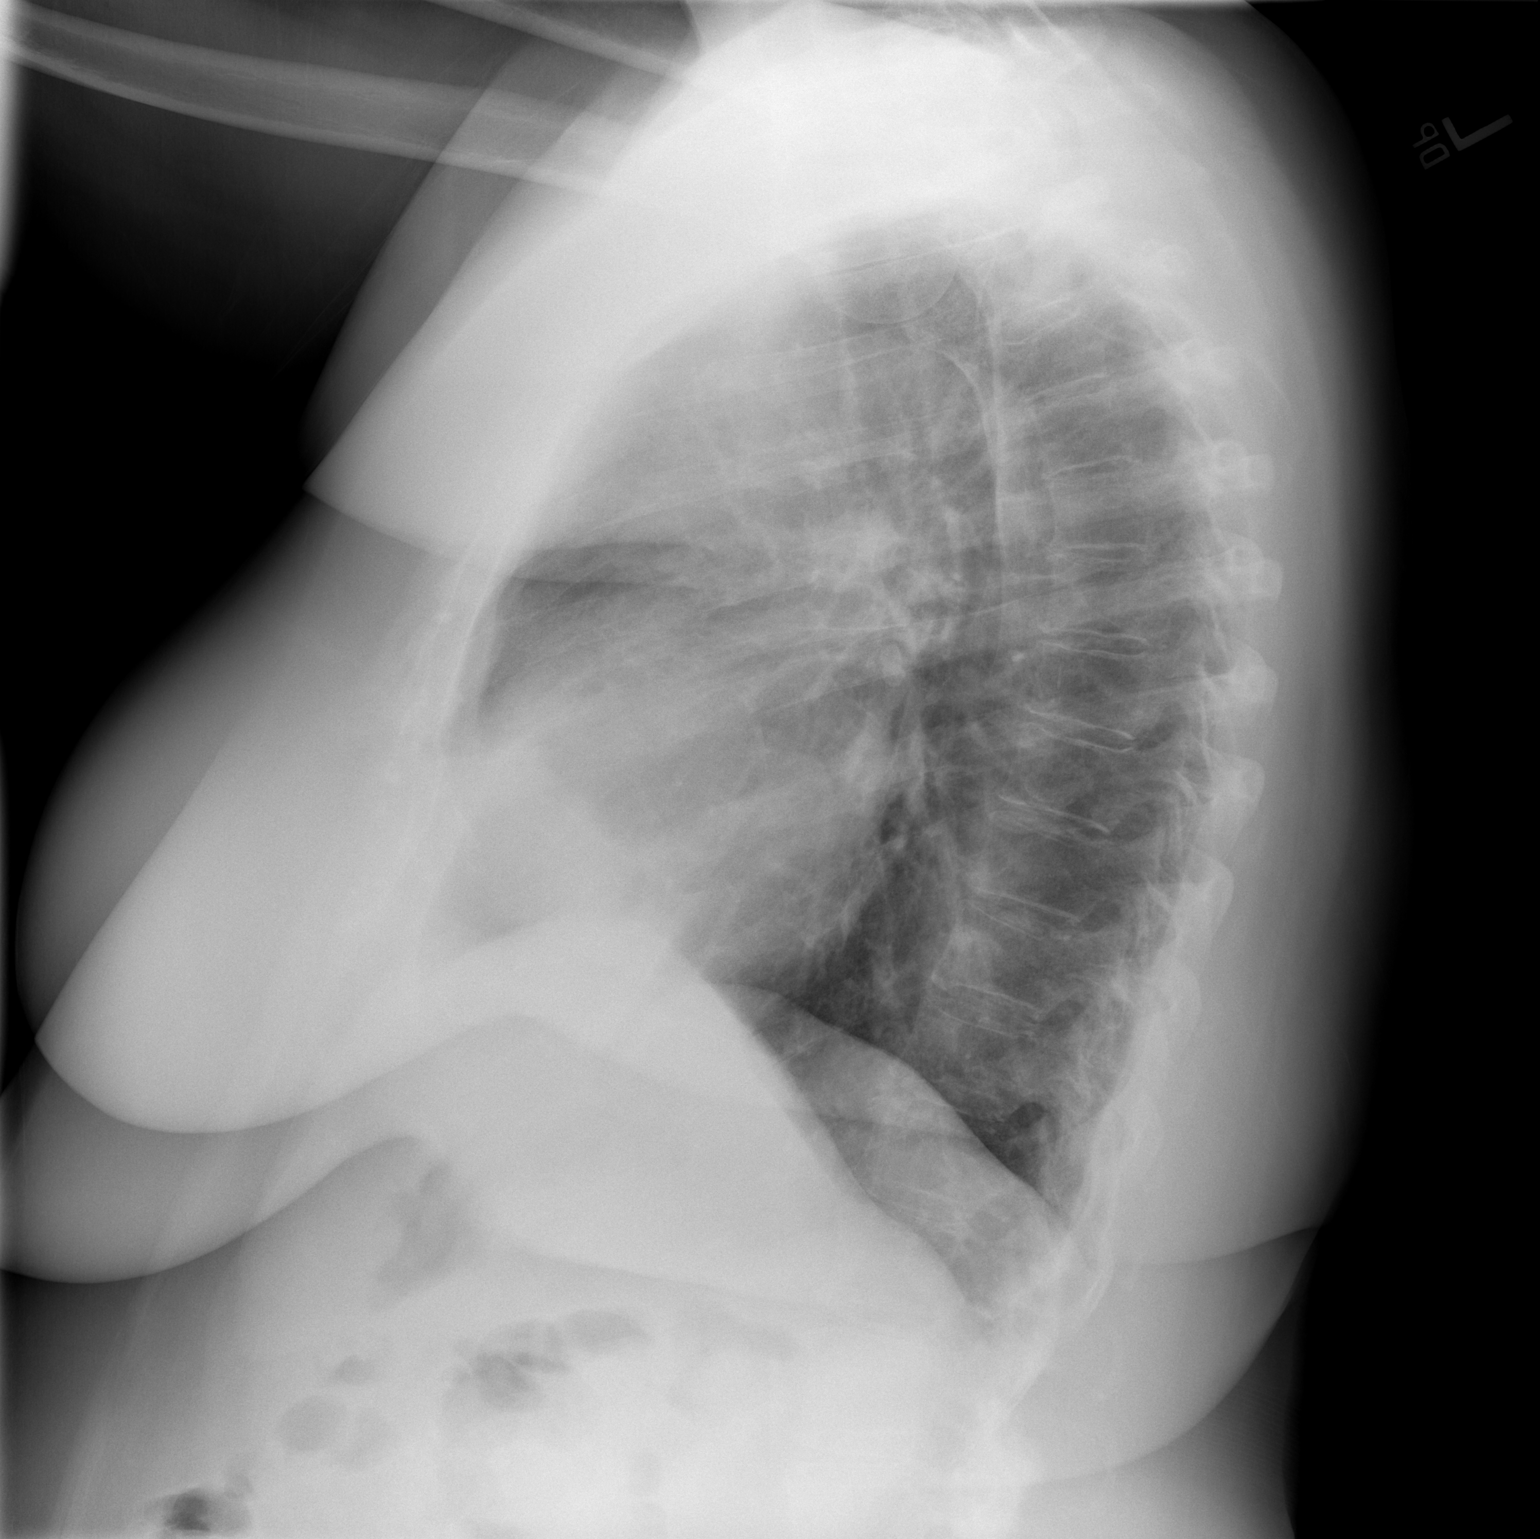

[2 of 2 positions shown; findings below may reference images not displayed]

FINDINGS: Mild cardiomegaly. Both lungs are clear. The visualized skeletal
structures are unremarkable.
IMPRESSION: Mild cardiomegaly without acute abnormality of the lungs.

## 2022-04-26 DIAGNOSIS — E876 Hypokalemia: Secondary | ICD-10-CM | POA: Diagnosis not present

## 2022-05-10 NOTE — Progress Notes (Unsigned)
Cardiology Office Note:    Date:  05/11/2022   ID:  Jamie Gilmore, DOB 11/01/1945, MRN [id number]  PCP:  Maurice Small, MD  Cardiologist:  None  Electrophysiologist:  None   Referring MD: Freddi Starr, MD   Chief Complaint  Patient presents with   Follow-up   Shortness of Breath     History of Present Illness:    Jamie Gilmore is a 76 y.o. female with a hx of hypertension, DVT following knee replacement, tobacco use, hyperlipidemia who presents for follow-up.  She was referred by Dr. Justin Mend for evaluation of chest pain and dyspnea following COVID-19 infection, initially seen on 07/16/2020.  She reports that she has had shortness of breath for years.  Had COVID-19 infection in August 2021.  Reports that dyspnea has worsened since that time.  Reports shortness of breath with minimal exertion.  Also reports she has been having chest pain with deep breathing.  Occurs about once per week, described as sharp pain.  Reports lightheadedness with exertion, denies any syncope.  States that she is lightheaded with standing.  Does report some lower extremity edema at night.  Denies any palpitations.  Recently quit smoking.  Family history includes father had CHF, died at age 80.  Echocardiogram 03/09/2021 showed normal biventricular function, no significant valvular disease.  Zio patch x5 days on 03/18/2021 showed no significant abnormalities.  Since last clinic visit, she reports that she has been doing okay.  Continues to have shortness of breath with exertion.  Also reports occasional chest pain.  Describes pain in his lower center of her chest, occurs 1-2 times per week.  Has noted when taking deep breaths.  Also occurs with exertion at times.  Having lightheadedness with exertion as well, denies any syncope.  Has been doing weight watchers, has lost 60 pounds.   Wt Readings from Last 3 Encounters:  05/11/22 182 lb (82.6 kg)  01/04/22 209 lb 12.8 oz (95.2 kg)  10/07/21 240 lb 12.8 oz  (109.2 kg)       she reports she has been having issues with shortness of breath and lightheadedness/near syncope.  Reports shortness of breath has been occurring a few times per week, occurs with minimal exertion.  Had episode of severe shortness of breath while walking in [employer] last week, lasting 10 to 15 minutes.  She denies any chest pain.  Reports feeling lightheaded during these episodes, but also has been having episodes of sudden lightheadedness/near syncope that can occur at rest.  She denies any palpitations.  Also reports leg swelling at the end of the day.  Past Medical History:  Diagnosis Date   Acute blood loss anemia 08/01/2012   Arthritis    Depression    Elevated blood sugar    "borderline diabetes"   GERD (gastroesophageal reflux disease)    Headache(784.0)    history migraines   Hypertension    Peripheral vascular disease ([place]) 05/2010   DVT post op knee replacement   Shortness of breath    "all my life"- rarely use    Past Surgical History:  Procedure Laterality Date   CARPAL TUNNEL RELEASE     left   JOINT REPLACEMENT  05/2010   left knee   RECTOVAGINAL FISTULA CLOSURE     TONSILLECTOMY     TOTAL HIP ARTHROPLASTY  07/29/2012   Procedure: TOTAL HIP ARTHROPLASTY;  Surgeon: Tobi Bastos, MD;  Location: [facility];  Service: Orthopedics;  Laterality: Left;   TOTAL KNEE ARTHROPLASTY  Current Medications: Current Meds  Medication Sig   albuterol (PROVENTIL HFA;VENTOLIN HFA) 108 (90 BASE) MCG/ACT inhaler Inhale 2 puffs into the lungs every 6 (six) hours as needed. Wheezing and shortness of breath   ALPRAZolam (XANAX) 0.25 MG tablet Take 1 tablet by mouth daily as needed.   aspirin 325 MG tablet 1 tablet   atorvastatin (LIPITOR) 20 MG tablet Take 1 tablet by mouth daily.   FLUoxetine (PROZAC) 40 MG capsule    gabapentin (NEURONTIN) 300 MG capsule Take 300 mg by mouth at bedtime.   hydrochlorothiazide (HYDRODIURIL) 25 MG tablet Take 25 mg by mouth daily  before breakfast.    metFORMIN (GLUCOPHAGE-XR) 500 MG 24 hr tablet Take 1 tablet by mouth daily.   pantoprazole (PROTONIX) 40 MG tablet Take 1 tablet by mouth daily.   traMADol (ULTRAM) 50 MG tablet Take 50 mg by mouth every 6 (six) hours as needed for moderate pain.   traZODone (DESYREL) 50 MG tablet Take 1 tablet (50 mg total) by mouth at bedtime.     Allergies:   Lisinopril   Social History   Socioeconomic History   Marital status: Divorced    Spouse name: Not on file   Number of children: Not on file   Years of education: Not on file   Highest education level: Not on file  Occupational History   Not on file  Tobacco Use   Smoking status: Former    Packs/day: 2.00    Types: Cigarettes    Quit date: 10/30/2016    Years since quitting: 5.5   Smokeless tobacco: Never  Vaping Use   Vaping Use: Never used  Substance and Sexual Activity   Alcohol use: Yes    Comment: 1-2 glasses white wine   Drug use: No   Sexual activity: Not on file  Other Topics Concern   Not on file  Social History Narrative   Not on file   Social Determinants of Health   Financial Resource Strain: Not on file  Food Insecurity: Not on file  Transportation Needs: Not on file  Physical Activity: Not on file  Stress: Not on file  Social Connections: Not on file     Family History: The patient's family history includes Asthma in her father; Diabetes in her mother; Parkinson's disease in her mother; Tuberculosis in her father.  ROS:   Please see the history of present illness.     All other systems reviewed and are negative.  EKGs/Labs/Other Studies Reviewed:    The following studies were reviewed today:   EKG:  EKG is ordered today.  The ekg ordered today demonstrates normal sinus rhythm, rate 68, no ST abnormalities  Recent Labs: 02/22/2022: ALT 23; BUN 10; Creatinine, Ser 0.68; Hemoglobin 12.5; Magnesium 1.8; Platelets 321; Potassium 3.0; Sodium 142; TSH 1.293  Recent Lipid Panel No  results found for: "CHOL", "TRIG", "HDL", "CHOLHDL", "VLDL", "LDLCALC", "LDLDIRECT"  Physical Exam:    VS:  BP 118/68 (BP Location: Left Arm, Patient Position: Sitting, Cuff Size: Normal)   Pulse 75   Ht 5\' 4"  (1.626 m)   Wt 182 lb (82.6 kg)   SpO2 99%   BMI 31.24 kg/m     Wt Readings from Last 3 Encounters:  05/11/22 182 lb (82.6 kg)  01/04/22 209 lb 12.8 oz (95.2 kg)  10/07/21 240 lb 12.8 oz (109.2 kg)     GEN: Well nourished, well developed in no acute distress HEENT: Normal NECK: No JVD; No carotid bruits LYMPHATICS: No lymphadenopathy  CARDIAC: RRR, no murmurs, rubs, gallops RESPIRATORY:  Clear to auscultation without rales, wheezing or rhonchi  ABDOMEN: Soft, non-tender, non-distended MUSCULOSKELETAL:  No edema; No deformity  SKIN: Warm and dry NEUROLOGIC:  Alert and oriented x 3 PSYCHIATRIC:  Normal affect   ASSESSMENT:    1. Chest pain of uncertain etiology   2. DOE (dyspnea on exertion)   3. Lightheadedness   4. Essential hypertension   5. Hyperlipidemia, unspecified hyperlipidemia type   6. Snoring      PLAN:    Chest pain/DOE: Reports atypical in chest pain, but can occur with exertion suggesting possible angina.  Also reports dyspnea with minimal exertion.Has significant CAD risk factors (hypertension, hyperlipidemia, tobacco use, age).  Echocardiogram 03/09/2021 showed normal biventricular function, no significant valvular disease.   -Recommend coronary CTA.  Will give Lopressor 50 mg prior to study  Lightheadedness/near syncope: Orthostatics at previous clinic visit were unremarkable.  Zio patch x5 days on 03/18/2021 showed no significant abnormalities.  Hypertension: On hydrochlorothiazide 25 mg daily.  Appears controlled.  Will check BMP  Hyperlipidemia: On atorvastatin 20 mg daily.  LDL 104 on 06/30/2020.  Check lipid panel.  We will follow-up results of coronary CTA to guide how aggressive to be in lowering cholesterol  Snoring: Will check [facility]  sleep study. STOPBANG 4  RTC in 6 months     Medication Adjustments/Labs and Tests Ordered: Current medicines are reviewed at length with the patient today.  Concerns regarding medicines are outlined above.  No orders of the defined types were placed in this encounter.   No orders of the defined types were placed in this encounter.    Patient Instructions  Medication Instructions:  Your physician recommends that you continue on your current medications as directed. Please refer to the Current Medication list given to you today.  *If you need a refill on your cardiac medications before your next appointment, please call your pharmacy*   Lab Work: BMET, Lipid today  If you have labs (blood work) drawn today and your tests are completely normal, you will receive your results only by: [place] (if you have MyChart) OR A paper copy in the mail If you have any lab test that is abnormal or we need to change your treatment, we will call you to review the results.   Testing/Procedures: Coronary CTA-see instructions below  WatchPAT?  Is a FDA cleared portable home sleep study test that uses a watch and 3 points of contact to monitor 7 different channels, including your heart rate, oxygen saturations, body position, snoring, and chest motion.  The study is easy to use from the comfort of your own home and accurately detect sleep apnea.  Before bed, you attach the chest sensor, attached the sleep apnea bracelet to your nondominant hand, and attach the finger probe.  After the study, the raw data is downloaded from the watch and scored for apnea events.   For more information: [email or web address]  Patient Testing Instructions:  Do not put battery into the device until bedtime when you are ready to begin the test. Please call the support number if you need assistance after following the instructions below: 24 hour support line- [phone] or [facility] support at  [phone] (option 2)  Download the [employer] AmericanWatchPAT One" app through the google play store or App Store  Be sure to turn on or enable access to bluetooth in [place] on your smartphone/ device  Make sure no other bluetooth devices are on and within the vicinity  of your smartphone/ device and WatchPAT watch during testing.  Make sure to leave your smart phone/ device plugged in and charging all night.  When ready for bed:  Follow the instructions step by step in the WatchPAT One App to activate the testing device. For additional instructions, including video instruction, visit the WatchPAT One video on Youtube. You can search for [place] One within Youtube (video is 4 minutes and 18 seconds) or enter: https://youtube/watch?v=BCce_vbiwxE Please note: You will be prompted to enter a Pin to connect via bluetooth when starting the test. The PIN will be assigned to you when you receive the test.  The device is disposable, but it recommended that you retain the device until you receive a call letting you know the study has been received and the results have been interpreted.  We will let you know if the study did not transmit to [place] properly after the test is completed. You do not need to call us to confirm the receipt of the test.  Please complete the test within 48 hours of receiving PIN.   Frequently Asked Questions:  What is Watch [place] one?  A single use fully disposable home sleep apnea testing device and will not need to be returned after completion.  What are the requirements to use WatchPAT one?  The be able to have a successful watchpat one sleep study, you should have your Watch pat one device, your smart phone, watch pat one app, your PIN number and Internet access What type of phone do I need?  You should have a smart phone that uses Android 5.1 and above or any Iphone with IOS 10 and above How can I download the WatchPAT one app?  Based on your device type search for WatchPAT one app  either in google play for android devices or APP store for Iphone's Where will I get my PIN for the study?  Your PIN will be provided by your physician's office. It is used for authentication and if you lose/forget your PIN, please reach out to your providers office.  I do not have Internet at home. Can I do WatchPAT one study?  WatchPAT One needs Internet connection throughout the night to be able to transmit the sleep data. You can use your home/local internet or your cellular's data package. However, it is always recommended to use home/local Internet. It is estimated that between 20MB-30MB will be used with each study.However, the application will be looking for 80MB space in the phone to start the study.  What happens if I lose internet or bluetooth connection?  During the internet disconnection, your phone will not be able to transmit the sleep data. All the data, will be stored in your phone. As soon as the internet connection is back on, the phone will being sending the sleep data. During the bluetooth disconnection, WatchPAT one will not be able to to send the sleep data to your phone. Data will be kept in the [facility] one until two devices have bluetooth connection back on. As soon as the connection is back on, WatchPAT one will send the sleep data to the phone.  How long do I need to wear the WatchPAT one?  After you start the study, you should wear the device at least 6 hours.  How far should I keep my phone from the device?  During the night, your phone should be within 15 feet.  What happens if I leave the room for restroom or other reasons?  Leaving  the room for any reason will not cause any problem. As soon as your get back to the room, both devices will reconnect and will continue to send the sleep data. Can I use my phone during the sleep study?  Yes, you can use your phone as usual during the study. But it is recommended to put your watchpat one on when you are ready to go to bed.   How will I get my study results?  A soon as you completed your study, your sleep data will be sent to the provider. They will then share the results with you when they are ready.    Follow-Up: At [facility], you and your health needs are our priority.  As part of our continuing mission to provide you with exceptional heart care, we have created designated Provider Care Teams.  These Care Teams include your primary Cardiologist (physician) and Advanced Practice Providers (APPs -  Physician Assistants and Nurse Practitioners) who all work together to provide you with the care you need, when you need it.  We recommend signing up for the patient portal called "MyChart".  Sign up information is provided on this After Visit Summary.  MyChart is used to connect with patients for Virtual Visits (Telemedicine).  Patients are able to view lab/test results, encounter notes, upcoming appointments, etc.  Non-urgent messages can be sent to your provider as well.   To learn more about what you can do with MyChart, go to [email or web address].    Your next appointment:   6 month(s)  The format for your next appointment:   In Person  Provider:   Dr. Gardiner Rhyme Other Instructions   Your cardiac CT will be scheduled at one of the below locations:   [facility] [address] [place], [place] [address] [phone]  If scheduled at [facility], please arrive at the [facility] and Children's Entrance (Entrance C2) of [facility] 30 minutes prior to test start time. You can use the FREE valet parking offered at entrance C (encouraged to control the heart rate for the test)  Proceed to the [facility] Radiology Department (first floor) to check-in and test prep.  All radiology patients and guests should use entrance C2 at [facility], accessed from [facility], even though the hospital's physical address listed is [address]      Please follow these instructions carefully (unless otherwise directed):  On the Night Before the Test: Be sure to Drink plenty of water. Do not consume any caffeinated/decaffeinated beverages or chocolate 12 hours prior to your test. Do not take any antihistamines 12 hours prior to your test.  On the Day of the Test: Drink plenty of water until 1 hour prior to the test. Do not eat any food 1 hour prior to test. You may take your regular medications prior to the test.  Take metoprolol (Lopressor) two hours prior to test. HOLD Hydrochlorothiazide morning of the test. FEMALES- please wear underwire-free bra if available, avoid dresses & tight clothing      After the Test: Drink plenty of water. After receiving IV contrast, you may experience a mild flushed feeling. This is normal. On occasion, you may experience a mild rash up to 24 hours after the test. This is not dangerous. If this occurs, you can take Benadryl 25 mg and increase your fluid intake. If you experience trouble breathing, this can be serious. If it is severe call 911 IMMEDIATELY. If  it is mild, please call our office. If you take any of these medications: Glipizide/Metformin, Avandament, Glucavance, please do not take 48 hours after completing test unless otherwise instructed.  We will call to schedule your test 2-4 weeks out understanding that some insurance companies will need an authorization prior to the service being performed.   For non-scheduling related questions, please contact the cardiac imaging nurse navigator should you have any questions/concerns: Marchia Bond, Cardiac Imaging Nurse Navigator Gordy Clement, Cardiac Imaging Nurse Navigator  [facility] V[facility] Services Direct Office Dial: [phone]   For scheduling needs, including cancellations and rescheduling, please call [place], [phone].           Signed, Donato Heinz, MD  05/11/2022 3:37 PM     [facility]

## 2022-05-11 ENCOUNTER — Ambulatory Visit: Payer: Medicare HMO | Attending: Cardiology | Admitting: Cardiology

## 2022-05-11 ENCOUNTER — Encounter: Payer: Self-pay | Admitting: Cardiology

## 2022-05-11 VITALS — BP 118/68 | HR 75 | Ht 64.0 in | Wt 182.0 lb

## 2022-05-11 DIAGNOSIS — E785 Hyperlipidemia, unspecified: Secondary | ICD-10-CM | POA: Diagnosis not present

## 2022-05-11 DIAGNOSIS — I10 Essential (primary) hypertension: Secondary | ICD-10-CM

## 2022-05-11 DIAGNOSIS — R42 Dizziness and giddiness: Secondary | ICD-10-CM

## 2022-05-11 DIAGNOSIS — R0609 Other forms of dyspnea: Secondary | ICD-10-CM | POA: Diagnosis not present

## 2022-05-11 DIAGNOSIS — R0683 Snoring: Secondary | ICD-10-CM

## 2022-05-11 DIAGNOSIS — R079 Chest pain, unspecified: Secondary | ICD-10-CM

## 2022-05-11 MED ORDER — METOPROLOL TARTRATE 50 MG PO TABS: ORAL_TABLET | ORAL | 0 refills | Status: DC

## 2022-05-11 NOTE — Patient Instructions (Signed)
Medication Instructions:  Your physician recommends that you continue on your current medications as directed. Please refer to the Current Medication list given to you today.  *If you need a refill on your cardiac medications before your next appointment, please call your pharmacy*   Lab Work: BMET, Lipid today  If you have labs (blood work) drawn today and your tests are completely normal, you will receive your results only by: MyChart Message (if you have MyChart) OR A paper copy in the mail If you have any lab test that is abnormal or we need to change your treatment, we will call you to review the results.   Testing/Procedures: Coronary CTA-see instructions below  WatchPAT?  Is a FDA cleared portable home sleep study test that uses a watch and 3 points of contact to monitor 7 different channels, including your heart rate, oxygen saturations, body position, snoring, and chest motion.  The study is easy to use from the comfort of your own home and accurately detect sleep apnea.  Before bed, you attach the chest sensor, attached the sleep apnea bracelet to your nondominant hand, and attach the finger probe.  After the study, the raw data is downloaded from the watch and scored for apnea events.   For more information: [email or web address]  Patient Testing Instructions:  Do not put battery into the device until bedtime when you are ready to begin the test. Please call the support number if you need assistance after following the instructions below: 24 hour support line- [phone] or ITAMAR support at [phone] (option 2)  Download the IntelWatchPAT One" app through the google play store or App Store  Be sure to turn on or enable access to bluetooth in settlings on your smartphone/ device  Make sure no other bluetooth devices are on and within the vicinity of your smartphone/ device and WatchPAT watch during testing.  Make sure to leave your smart phone/ device  plugged in and charging all night.  When ready for bed:  Follow the instructions step by step in the WatchPAT One App to activate the testing device. For additional instructions, including video instruction, visit the WatchPAT One video on Youtube. You can search for WatchPat One within Youtube (video is 4 minutes and 18 seconds) or enter: https://youtube/watch?v=BCce_vbiwxE Please note: You will be prompted to enter a Pin to connect via bluetooth when starting the test. The PIN will be assigned to you when you receive the test.  The device is disposable, but it recommended that you retain the device until you receive a call letting you know the study has been received and the results have been interpreted.  We will let you know if the study did not transmit to [place] properly after the test is completed. You do not need to call us to confirm the receipt of the test.  Please complete the test within 48 hours of receiving PIN.   Frequently Asked Questions:  What is Watch Dennie Bible one?  A single use fully disposable home sleep apnea testing device and will not need to be returned after completion.  What are the requirements to use WatchPAT one?  The be able to have a successful watchpat one sleep study, you should have your Watch pat one device, your smart phone, watch pat one app, your PIN number and Internet access What type of phone do I need?  You should have a smart phone that uses Android 5.1 and above or any Iphone with IOS 10 and above How can  I download the WatchPAT one app?  Based on your device type search for WatchPAT one app either in google play for android devices or APP store for Iphone's Where will I get my PIN for the study?  Your PIN will be provided by your physician's office. It is used for authentication and if you lose/forget your PIN, please reach out to your providers office.  I do not have Internet at home. Can I do WatchPAT one study?  WatchPAT One needs Internet connection  throughout the night to be able to transmit the sleep data. You can use your home/local internet or your cellular's data package. However, it is always recommended to use home/local Internet. It is estimated that between 20MB-30MB will be used with each study.However, the application will be looking for 80MB space in the phone to start the study.  What happens if I lose internet or bluetooth connection?  During the internet disconnection, your phone will not be able to transmit the sleep data. All the data, will be stored in your phone. As soon as the internet connection is back on, the phone will being sending the sleep data. During the bluetooth disconnection, WatchPAT one will not be able to to send the sleep data to your phone. Data will be kept in the [facility] one until two devices have bluetooth connection back on. As soon as the connection is back on, WatchPAT one will send the sleep data to the phone.  How long do I need to wear the WatchPAT one?  After you start the study, you should wear the device at least 6 hours.  How far should I keep my phone from the device?  During the night, your phone should be within 15 feet.  What happens if I leave the room for restroom or other reasons?  Leaving the room for any reason will not cause any problem. As soon as your get back to the room, both devices will reconnect and will continue to send the sleep data. Can I use my phone during the sleep study?  Yes, you can use your phone as usual during the study. But it is recommended to put your watchpat one on when you are ready to go to bed.  How will I get my study results?  A soon as you completed your study, your sleep data will be sent to the provider. They will then share the results with you when they are ready.    Follow-Up: At [facility], you and your health needs are our priority.  As part of our continuing mission to provide you with exceptional heart care, we have created designated  Provider Care Teams.  These Care Teams include your primary Cardiologist (physician) and Advanced Practice Providers (APPs -  Physician Assistants and Nurse Practitioners) who all work together to provide you with the care you need, when you need it.  We recommend signing up for the patient portal called "MyChart".  Sign up information is provided on this After Visit Summary.  MyChart is used to connect with patients for Virtual Visits (Telemedicine).  Patients are able to view lab/test results, encounter notes, upcoming appointments, etc.  Non-urgent messages can be sent to your provider as well.   To learn more about what you can do with MyChart, go to [email or web address].    Your next appointment:   6 month(s)  The format for your next appointment:   In Person  Provider:   Dr. Gardiner Rhyme Other Instructions   Your  cardiac CT will be scheduled at one of the below locations:   [facility] [address] [place], [place] [address] [phone]  If scheduled at [facility], please arrive at the [facility] and Children's Entrance (Entrance C2) of [facility] 30 minutes prior to test start time. You can use the FREE valet parking offered at entrance C (encouraged to control the heart rate for the test)  Proceed to the [facility] Radiology Department (first floor) to check-in and test prep.  All radiology patients and guests should use entrance C2 at [facility], accessed from [facility], even though the hospital's physical address listed is [address].      Please follow these instructions carefully (unless otherwise directed):  On the Night Before the Test: Be sure to Drink plenty of water. Do not consume any caffeinated/decaffeinated beverages or chocolate 12 hours prior to your test. Do not take any antihistamines 12 hours prior to your test.  On the Day of the Test: Drink plenty of water until 1 hour prior to the  test. Do not eat any food 1 hour prior to test. You may take your regular medications prior to the test.  Take metoprolol (Lopressor) two hours prior to test. HOLD Hydrochlorothiazide morning of the test. FEMALES- please wear underwire-free bra if available, avoid dresses & tight clothing      After the Test: Drink plenty of water. After receiving IV contrast, you may experience a mild flushed feeling. This is normal. On occasion, you may experience a mild rash up to 24 hours after the test. This is not dangerous. If this occurs, you can take Benadryl 25 mg and increase your fluid intake. If you experience trouble breathing, this can be serious. If it is severe call 911 IMMEDIATELY. If it is mild, please call our office. If you take any of these medications: Glipizide/Metformin, Avandament, Glucavance, please do not take 48 hours after completing test unless otherwise instructed.  We will call to schedule your test 2-4 weeks out understanding that some insurance companies will need an authorization prior to the service being performed.   For non-scheduling related questions, please contact the cardiac imaging nurse navigator should you have any questions/concerns: Rockwell Alexandria, Cardiac Imaging Nurse Navigator Larey Brick, Cardiac Imaging Nurse Navigator [facility] Direct Office Dial: [phone]   For scheduling needs, including cancellations and rescheduling, please call [place], [phone].

## 2022-05-12 ENCOUNTER — Encounter: Payer: Self-pay | Admitting: Cardiology

## 2022-05-12 DIAGNOSIS — M545 Low back pain, unspecified: Secondary | ICD-10-CM | POA: Diagnosis not present

## 2022-05-12 DIAGNOSIS — M25512 Pain in left shoulder: Secondary | ICD-10-CM | POA: Diagnosis not present

## 2022-05-12 LAB — BASIC METABOLIC PANEL
BUN/Creatinine Ratio: 14 (ref 12–28)
BUN: 13 mg/dL (ref 8–27)
CO2: 24 mmol/L (ref 20–29)
Calcium: 9.3 mg/dL (ref 8.7–10.3)
Chloride: 97 mmol/L (ref 96–106)
Creatinine, Ser: 0.9 mg/dL (ref 0.57–1.00)
Glucose: 80 mg/dL (ref 70–99)
Potassium: 4.4 mmol/L (ref 3.5–5.2)
Sodium: 142 mmol/L (ref 134–144)
eGFR: 67 mL/min/{1.73_m2} (ref >59)

## 2022-05-12 LAB — LIPID PANEL
Chol/HDL Ratio: 3.5 ratio (ref 0.0–4.4)
Cholesterol, Total: 259 mg/dL — ABNORMAL HIGH (ref 100–199)
HDL: 74 mg/dL (ref >39)
LDL Chol Calc (NIH): 166 mg/dL — ABNORMAL HIGH (ref 0–99)
Triglycerides: 110 mg/dL (ref 0–149)
VLDL Cholesterol Cal: 19 mg/dL (ref 5–40)

## 2022-05-12 MED ORDER — ATORVASTATIN CALCIUM 80 MG PO TABS: 80.0000 mg | ORAL_TABLET | Freq: Every day | ORAL | 3 refills | Status: DC

## 2022-05-15 ENCOUNTER — Telehealth: Payer: Self-pay | Admitting: *Deleted

## 2022-05-15 NOTE — Telephone Encounter (Signed)
Staff message sent to Stacy Green ok to activate itamar device.  

## 2022-05-25 ENCOUNTER — Telehealth (HOSPITAL_COMMUNITY): Payer: Self-pay | Admitting: *Deleted

## 2022-05-25 NOTE — Telephone Encounter (Signed)
Attempted to call patient regarding upcoming cardiac CT appointment. °Left message on voicemail with name and callback number ° °Ger Nicks RN Navigator Cardiac Imaging °[facility] °[phone] Office °[phone] Cell ° °

## 2022-05-26 ENCOUNTER — Other Ambulatory Visit: Payer: Self-pay | Admitting: *Deleted

## 2022-05-26 ENCOUNTER — Ambulatory Visit (HOSPITAL_COMMUNITY)
Admission: RE | Admit: 2022-05-26 | Discharge: 2022-05-26 | Disposition: A | Discharge status: Home / Self Care | Payer: Medicare HMO | Source: Ambulatory Visit | Attending: Cardiology | Admitting: Cardiology

## 2022-05-26 DIAGNOSIS — R911 Solitary pulmonary nodule: Secondary | ICD-10-CM

## 2022-05-26 DIAGNOSIS — R079 Chest pain, unspecified: Secondary | ICD-10-CM | POA: Diagnosis not present

## 2022-05-26 DIAGNOSIS — I700 Atherosclerosis of aorta: Secondary | ICD-10-CM | POA: Diagnosis not present

## 2022-05-26 DIAGNOSIS — I2510 Atherosclerotic heart disease of native coronary artery without angina pectoris: Secondary | ICD-10-CM | POA: Insufficient documentation

## 2022-05-26 DIAGNOSIS — R0609 Other forms of dyspnea: Secondary | ICD-10-CM | POA: Insufficient documentation

## 2022-05-26 MED ORDER — IOHEXOL 350 MG/ML SOLN
100.0000 mL | Freq: Once | INTRAVENOUS | Status: AC | PRN
  Administered 2022-05-26: 100 mL via INTRAVENOUS

## 2022-05-26 MED ORDER — NITROGLYCERIN 0.4 MG SL SUBL
SUBLINGUAL_TABLET | SUBLINGUAL | Status: AC
  Filled 2022-05-26: qty 2

## 2022-05-26 MED ORDER — NITROGLYCERIN 0.4 MG SL SUBL
0.8000 mg | SUBLINGUAL_TABLET | Freq: Once | SUBLINGUAL | Status: AC
  Administered 2022-05-26: 0.8 mg via SUBLINGUAL

## 2022-06-09 DIAGNOSIS — H43812 Vitreous degeneration, left eye: Secondary | ICD-10-CM | POA: Diagnosis not present

## 2022-06-09 DIAGNOSIS — H43392 Other vitreous opacities, left eye: Secondary | ICD-10-CM | POA: Diagnosis not present

## 2022-07-20 DIAGNOSIS — Z0000 Encounter for general adult medical examination without abnormal findings: Secondary | ICD-10-CM | POA: Diagnosis not present

## 2022-07-20 DIAGNOSIS — Z23 Encounter for immunization: Secondary | ICD-10-CM | POA: Diagnosis not present

## 2022-07-20 DIAGNOSIS — D649 Anemia, unspecified: Secondary | ICD-10-CM | POA: Diagnosis not present

## 2022-07-20 DIAGNOSIS — E2839 Other primary ovarian failure: Secondary | ICD-10-CM | POA: Diagnosis not present

## 2022-07-20 DIAGNOSIS — E538 Deficiency of other specified B group vitamins: Secondary | ICD-10-CM | POA: Diagnosis not present

## 2022-07-20 DIAGNOSIS — I10 Essential (primary) hypertension: Secondary | ICD-10-CM | POA: Diagnosis not present

## 2022-07-20 DIAGNOSIS — E559 Vitamin D deficiency, unspecified: Secondary | ICD-10-CM | POA: Diagnosis not present

## 2022-07-20 DIAGNOSIS — Z1211 Encounter for screening for malignant neoplasm of colon: Secondary | ICD-10-CM | POA: Diagnosis not present

## 2022-07-20 DIAGNOSIS — R5383 Other fatigue: Secondary | ICD-10-CM | POA: Diagnosis not present

## 2022-07-20 DIAGNOSIS — N644 Mastodynia: Secondary | ICD-10-CM | POA: Diagnosis not present

## 2022-07-20 DIAGNOSIS — E1169 Type 2 diabetes mellitus with other specified complication: Secondary | ICD-10-CM | POA: Diagnosis not present

## 2022-07-20 DIAGNOSIS — E785 Hyperlipidemia, unspecified: Secondary | ICD-10-CM | POA: Diagnosis not present

## 2022-07-26 ENCOUNTER — Other Ambulatory Visit: Payer: Self-pay | Admitting: Family Medicine

## 2022-07-26 DIAGNOSIS — N644 Mastodynia: Secondary | ICD-10-CM

## 2022-07-26 DIAGNOSIS — E2839 Other primary ovarian failure: Secondary | ICD-10-CM

## 2022-08-16 DIAGNOSIS — M2550 Pain in unspecified joint: Secondary | ICD-10-CM | POA: Diagnosis not present

## 2022-08-16 DIAGNOSIS — E876 Hypokalemia: Secondary | ICD-10-CM | POA: Diagnosis not present

## 2022-08-16 DIAGNOSIS — Z1211 Encounter for screening for malignant neoplasm of colon: Secondary | ICD-10-CM | POA: Diagnosis not present

## 2022-08-16 DIAGNOSIS — E538 Deficiency of other specified B group vitamins: Secondary | ICD-10-CM | POA: Diagnosis not present

## 2022-08-16 DIAGNOSIS — R2689 Other abnormalities of gait and mobility: Secondary | ICD-10-CM | POA: Diagnosis not present

## 2022-08-16 DIAGNOSIS — M791 Myalgia, unspecified site: Secondary | ICD-10-CM | POA: Diagnosis not present

## 2022-08-16 DIAGNOSIS — R5383 Other fatigue: Secondary | ICD-10-CM | POA: Diagnosis not present

## 2022-08-25 ENCOUNTER — Other Ambulatory Visit: Payer: Medicare HMO

## 2022-09-04 DIAGNOSIS — R195 Other fecal abnormalities: Secondary | ICD-10-CM | POA: Diagnosis not present

## 2022-09-04 DIAGNOSIS — E119 Type 2 diabetes mellitus without complications: Secondary | ICD-10-CM | POA: Diagnosis not present

## 2022-09-05 DIAGNOSIS — M25512 Pain in left shoulder: Secondary | ICD-10-CM | POA: Diagnosis not present

## 2022-09-05 DIAGNOSIS — M25511 Pain in right shoulder: Secondary | ICD-10-CM | POA: Diagnosis not present

## 2022-09-22 DIAGNOSIS — R195 Other fecal abnormalities: Secondary | ICD-10-CM | POA: Diagnosis not present

## 2022-09-27 ENCOUNTER — Ambulatory Visit
Admission: RE | Admit: 2022-09-27 | Discharge: 2022-09-27 | Disposition: A | Discharge status: Home / Self Care | Payer: Medicare HMO | Source: Ambulatory Visit | Attending: Family Medicine | Admitting: Family Medicine

## 2022-09-27 ENCOUNTER — Ambulatory Visit: Payer: Medicare HMO

## 2022-09-27 ENCOUNTER — Ambulatory Visit: Admission: RE | Admit: 2022-09-27 | Payer: Medicare HMO | Source: Ambulatory Visit

## 2022-09-27 DIAGNOSIS — N644 Mastodynia: Secondary | ICD-10-CM | POA: Diagnosis not present

## 2022-10-03 ENCOUNTER — Ambulatory Visit
Admission: RE | Admit: 2022-10-03 | Discharge: 2022-10-03 | Disposition: A | Discharge status: Home / Self Care | Payer: Medicare HMO | Source: Ambulatory Visit | Attending: Family Medicine | Admitting: Family Medicine

## 2022-10-03 DIAGNOSIS — M8589 Other specified disorders of bone density and structure, multiple sites: Secondary | ICD-10-CM | POA: Diagnosis not present

## 2022-10-03 DIAGNOSIS — E2839 Other primary ovarian failure: Secondary | ICD-10-CM

## 2022-10-03 DIAGNOSIS — Z780 Asymptomatic menopausal state: Secondary | ICD-10-CM | POA: Diagnosis not present

## 2022-10-04 ENCOUNTER — Encounter: Payer: Self-pay | Admitting: *Deleted

## 2022-10-24 ENCOUNTER — Telehealth: Payer: Self-pay | Admitting: *Deleted

## 2022-10-24 NOTE — Telephone Encounter (Signed)
Secure chat message sent to Jamie Gilmore to contact patient to inquire about itamar device she was given in October.

## 2022-11-07 DIAGNOSIS — Z1211 Encounter for screening for malignant neoplasm of colon: Secondary | ICD-10-CM | POA: Diagnosis not present

## 2022-11-07 DIAGNOSIS — K219 Gastro-esophageal reflux disease without esophagitis: Secondary | ICD-10-CM | POA: Diagnosis not present

## 2022-11-07 DIAGNOSIS — R1013 Epigastric pain: Secondary | ICD-10-CM | POA: Diagnosis not present

## 2022-11-07 DIAGNOSIS — K639 Disease of intestine, unspecified: Secondary | ICD-10-CM | POA: Diagnosis not present

## 2022-11-07 DIAGNOSIS — K589 Irritable bowel syndrome without diarrhea: Secondary | ICD-10-CM | POA: Diagnosis not present

## 2022-11-07 DIAGNOSIS — Z800 Family history of malignant neoplasm of digestive organs: Secondary | ICD-10-CM | POA: Diagnosis not present

## 2022-11-13 ENCOUNTER — Telehealth: Payer: Self-pay

## 2022-11-13 NOTE — Telephone Encounter (Signed)
LVM for patient to return my call concerning her Itamar Device.

## 2022-11-20 DIAGNOSIS — E785 Hyperlipidemia, unspecified: Secondary | ICD-10-CM | POA: Diagnosis not present

## 2022-11-20 DIAGNOSIS — E559 Vitamin D deficiency, unspecified: Secondary | ICD-10-CM | POA: Diagnosis not present

## 2022-11-20 DIAGNOSIS — I700 Atherosclerosis of aorta: Secondary | ICD-10-CM | POA: Diagnosis not present

## 2022-11-20 DIAGNOSIS — E876 Hypokalemia: Secondary | ICD-10-CM | POA: Diagnosis not present

## 2022-11-20 DIAGNOSIS — F418 Other specified anxiety disorders: Secondary | ICD-10-CM | POA: Diagnosis not present

## 2022-11-20 DIAGNOSIS — E538 Deficiency of other specified B group vitamins: Secondary | ICD-10-CM | POA: Diagnosis not present

## 2022-11-20 DIAGNOSIS — E1165 Type 2 diabetes mellitus with hyperglycemia: Secondary | ICD-10-CM | POA: Diagnosis not present

## 2022-11-20 DIAGNOSIS — I10 Essential (primary) hypertension: Secondary | ICD-10-CM | POA: Diagnosis not present

## 2022-11-23 ENCOUNTER — Ambulatory Visit (HOSPITAL_BASED_OUTPATIENT_CLINIC_OR_DEPARTMENT_OTHER): Payer: Medicare HMO

## 2023-01-02 DIAGNOSIS — M545 Low back pain, unspecified: Secondary | ICD-10-CM | POA: Diagnosis not present

## 2023-01-02 DIAGNOSIS — M25512 Pain in left shoulder: Secondary | ICD-10-CM | POA: Diagnosis not present

## 2023-01-02 DIAGNOSIS — M25511 Pain in right shoulder: Secondary | ICD-10-CM | POA: Diagnosis not present

## 2023-01-26 DIAGNOSIS — M545 Low back pain, unspecified: Secondary | ICD-10-CM | POA: Diagnosis not present

## 2023-01-26 DIAGNOSIS — M7062 Trochanteric bursitis, left hip: Secondary | ICD-10-CM | POA: Diagnosis not present

## 2023-02-12 NOTE — Telephone Encounter (Signed)
**Note De-Identified Dillon Mcreynolds Obfuscation** I called the pt to f/u with her on her Itamar-HST. She stated that she is not interested in doing the sleep test as she feels that it is not needed. She states that she is returning the Watch Pat sleep study device that we provided her with to our [place] office. She verified that the box is unopened and is as it was when we gave it to her.  She thanked me for following up with her.

## 2023-03-21 DIAGNOSIS — Z9989 Dependence on other enabling machines and devices: Secondary | ICD-10-CM | POA: Diagnosis not present

## 2023-03-21 DIAGNOSIS — I10 Essential (primary) hypertension: Secondary | ICD-10-CM | POA: Diagnosis not present

## 2023-03-21 DIAGNOSIS — F419 Anxiety disorder, unspecified: Secondary | ICD-10-CM | POA: Diagnosis not present

## 2023-03-21 DIAGNOSIS — E119 Type 2 diabetes mellitus without complications: Secondary | ICD-10-CM | POA: Diagnosis not present

## 2023-03-21 DIAGNOSIS — E785 Hyperlipidemia, unspecified: Secondary | ICD-10-CM | POA: Diagnosis not present

## 2023-03-21 DIAGNOSIS — E876 Hypokalemia: Secondary | ICD-10-CM | POA: Diagnosis not present

## 2023-03-21 DIAGNOSIS — E538 Deficiency of other specified B group vitamins: Secondary | ICD-10-CM | POA: Diagnosis not present

## 2023-04-20 ENCOUNTER — Other Ambulatory Visit: Payer: Self-pay | Admitting: Cardiology

## 2023-04-30 DIAGNOSIS — M545 Low back pain, unspecified: Secondary | ICD-10-CM | POA: Diagnosis not present

## 2023-05-04 DIAGNOSIS — M545 Low back pain, unspecified: Secondary | ICD-10-CM | POA: Diagnosis not present

## 2023-05-18 DIAGNOSIS — M545 Low back pain, unspecified: Secondary | ICD-10-CM | POA: Diagnosis not present

## 2023-06-25 DIAGNOSIS — R0609 Other forms of dyspnea: Secondary | ICD-10-CM | POA: Diagnosis not present

## 2023-06-25 DIAGNOSIS — Z86718 Personal history of other venous thrombosis and embolism: Secondary | ICD-10-CM | POA: Diagnosis not present

## 2023-07-03 DIAGNOSIS — M545 Low back pain, unspecified: Secondary | ICD-10-CM | POA: Diagnosis not present

## 2023-07-18 DIAGNOSIS — E876 Hypokalemia: Secondary | ICD-10-CM | POA: Diagnosis not present

## 2023-07-18 DIAGNOSIS — E785 Hyperlipidemia, unspecified: Secondary | ICD-10-CM | POA: Diagnosis not present

## 2023-07-18 DIAGNOSIS — E538 Deficiency of other specified B group vitamins: Secondary | ICD-10-CM | POA: Diagnosis not present

## 2023-07-18 DIAGNOSIS — E559 Vitamin D deficiency, unspecified: Secondary | ICD-10-CM | POA: Diagnosis not present

## 2023-07-23 DIAGNOSIS — E785 Hyperlipidemia, unspecified: Secondary | ICD-10-CM | POA: Diagnosis not present

## 2023-07-23 DIAGNOSIS — I10 Essential (primary) hypertension: Secondary | ICD-10-CM | POA: Diagnosis not present

## 2023-07-23 DIAGNOSIS — E538 Deficiency of other specified B group vitamins: Secondary | ICD-10-CM | POA: Diagnosis not present

## 2023-07-23 DIAGNOSIS — E119 Type 2 diabetes mellitus without complications: Secondary | ICD-10-CM | POA: Diagnosis not present

## 2023-07-23 DIAGNOSIS — Z0000 Encounter for general adult medical examination without abnormal findings: Secondary | ICD-10-CM | POA: Diagnosis not present

## 2023-07-23 DIAGNOSIS — E559 Vitamin D deficiency, unspecified: Secondary | ICD-10-CM | POA: Diagnosis not present

## 2023-07-23 DIAGNOSIS — Z1211 Encounter for screening for malignant neoplasm of colon: Secondary | ICD-10-CM | POA: Diagnosis not present

## 2023-08-12 NOTE — Progress Notes (Signed)
 Cardiology Office Note:    Date:  08/16/2023   ID:  Jamie Gilmore, DOB [birth year], MRN [id number]  PCP:  Kip Righter, MD  Cardiologist:  None  Electrophysiologist:  None   Referring MD: Kip Righter, MD   Chief Complaint  Patient presents with   Shortness of Breath    Patient stated she has SOB when she walks or climb stairs     History of Present Illness:    Jamie Gilmore is a 78 y.o. female with a hx of hypertension, DVT following knee replacement, tobacco use, hyperlipidemia who presents for follow-up.  She was referred by Dr. Douglass for evaluation of chest pain and dyspnea following COVID-19 infection, initially seen on 07/16/2020.  She reports that she has had shortness of breath for years.  Had COVID-19 infection in August 2021.  Reports that dyspnea has worsened since that time.  Reports shortness of breath with minimal exertion.  Also reports she has been having chest pain with deep breathing.  Occurs about once per week, described as sharp pain.  Reports lightheadedness with exertion, denies any syncope.  States that she is lightheaded with standing.  Does report some lower extremity edema at night.  Denies any palpitations.  Recently quit smoking.  Family history includes father had CHF, died at age 82.  Echocardiogram 03/09/2021 showed normal biventricular function, no significant valvular disease.  Zio patch x5 days on 03/18/2021 showed no significant abnormalities.  Coronary CTA 03/16/2022 showed nonobstructive CAD with less than 25% stenosis in left main, mid/distal LAD, mid LCx; calcium  score 141 (68th percentile).  Since last clinic visit, she reports she is doing okay.  Continues to have some chest pain at night.  She walks half a mile or so every day, denies any exertional chest pain but does get short of breath.  She reports some lightheadedness but denies any syncope.  Denies any palpitations.   Wt Readings from Last 3 Encounters:  08/16/23 203 lb (92.1 kg)   05/11/22 182 lb (82.6 kg)  01/04/22 209 lb 12.8 oz (95.2 kg)     Past Medical History:  Diagnosis Date   Acute blood loss anemia 08/01/2012   Arthritis    Depression    Elevated blood sugar    borderline diabetes   GERD (gastroesophageal reflux disease)    Headache(784.0)    history migraines   Hypertension    Peripheral vascular disease (HCC) 05/2010   DVT post op knee replacement   Shortness of breath    all my life- rarely use    Past Surgical History:  Procedure Laterality Date   CARPAL TUNNEL RELEASE     left   JOINT REPLACEMENT  05/2010   left knee   RECTOVAGINAL FISTULA CLOSURE     TONSILLECTOMY     TOTAL HIP ARTHROPLASTY  07/29/2012   Procedure: TOTAL HIP ARTHROPLASTY;  Surgeon: Tanda DELENA Heading, MD;  Location: [facility];  Service: Orthopedics;  Laterality: Left;   TOTAL KNEE ARTHROPLASTY      Current Medications: Current Meds  Medication Sig   ALPRAZolam (XANAX) 0.25 MG tablet Take 1 tablet by mouth daily as needed.   amLODipine  (NORVASC ) 2.5 MG tablet Take 2.5 mg by mouth daily.   aspirin  325 MG tablet 1 tablet   atorvastatin  (LIPITOR) 80 MG tablet TAKE 1 TABLET BY MOUTH EVERY DAY   ezetimibe  (ZETIA ) 10 MG tablet Take 1 tablet (10 mg total) by mouth daily.   FLUoxetine  (PROZAC ) 40 MG capsule  gabapentin (NEURONTIN) 300 MG capsule Take 300 mg by mouth at bedtime.   metFORMIN (GLUCOPHAGE-XR) 500 MG 24 hr tablet Take 1 tablet by mouth daily.   metoprolol  tartrate (LOPRESSOR ) 50 MG tablet Take 50 mg (1 tablet) TWO hours prior to CT scan   pantoprazole  (PROTONIX ) 40 MG tablet Take 1 tablet by mouth daily.   Potassium Chloride  ER 20 MEQ TBCR Take 1 tablet by mouth daily.   traMADol (ULTRAM) 50 MG tablet Take 50 mg by mouth every 6 (six) hours as needed for moderate pain.   traZODone  (DESYREL ) 50 MG tablet Take 1 tablet (50 mg total) by mouth at bedtime.     Allergies:   Lisinopril   Social History   Socioeconomic History   Marital status: Divorced     Spouse name: Not on file   Number of children: Not on file   Years of education: Not on file   Highest education level: Not on file  Occupational History   Not on file  Tobacco Use   Smoking status: Former    Current packs/day: 0.00    Types: Cigarettes    Quit date: 10/30/2016    Years since quitting: 6.7   Smokeless tobacco: Never  Vaping Use   Vaping status: Never Used  Substance and Sexual Activity   Alcohol use: Yes    Comment: 1-2 glasses white wine   Drug use: No   Sexual activity: Not Currently  Other Topics Concern   Not on file  Social History Narrative   Not on file   Social Drivers of Health   Financial Resource Strain: Not on file  Food Insecurity: Not on file  Transportation Needs: Not on file  Physical Activity: Not on file  Stress: Not on file  Social Connections: Not on file     Family History: The patient's family history includes Asthma in her father; Diabetes in her mother; Parkinson's disease in her mother; Tuberculosis in her father.  ROS:   Please see the history of present illness.     All other systems reviewed and are negative.  EKGs/Labs/Other Studies Reviewed:    The following studies were reviewed today:   EKG:   08/16/2023: Normal sinus rhythm, rate 69, no ST abnormalities  Recent Labs: No results found for requested labs within last 365 days.  Recent Lipid Panel    Component Value Date/Time   CHOL 259 (H) 05/11/2022 1552   TRIG 110 05/11/2022 1552   HDL 74 05/11/2022 1552   CHOLHDL 3.5 05/11/2022 1552   LDLCALC 166 (H) 05/11/2022 1552    Physical Exam:    VS:  BP 129/83 (BP Location: Left Arm, Patient Position: Sitting, Cuff Size: Normal)   Pulse 69   Ht 5' 4 (1.626 m)   Wt 203 lb (92.1 kg)   SpO2 99%   BMI 34.84 kg/m     Wt Readings from Last 3 Encounters:  08/16/23 203 lb (92.1 kg)  05/11/22 182 lb (82.6 kg)  01/04/22 209 lb 12.8 oz (95.2 kg)     GEN: Well nourished, well developed in no acute  distress HEENT: Normal NECK: No JVD; No carotid bruits LYMPHATICS: No lymphadenopathy CARDIAC: RRR, no murmurs, rubs, gallops RESPIRATORY:  Clear to auscultation without rales, wheezing or rhonchi  ABDOMEN: Soft, non-tender, non-distended MUSCULOSKELETAL:  No edema; No deformity  SKIN: Warm and dry NEUROLOGIC:  Alert and oriented x 3 PSYCHIATRIC:  Normal affect   ASSESSMENT:    1. CAD in native artery  2. Essential hypertension   3. Hyperlipidemia, unspecified hyperlipidemia type   4. Pulmonary nodule      PLAN:    CAD: Reports atypical chest pain.  Coronary CTA 03/16/2022 showed nonobstructive CAD with less than 25% stenosis in left main, mid/distal LAD, mid LCx; calcium  score 141 (68th percentile).  Echocardiogram 03/09/2021 showed normal biventricular function, no significant valvular disease.   -Continue atorvastatin   Lightheadedness/near syncope: Orthostatics at previous clinic visit were unremarkable.  Zio patch x5 days on 03/18/2021 showed no significant abnormalities.  Hypertension: On hydrochlorothiazide  25 mg daily.  Appears controlled.    Hyperlipidemia: On atorvastatin  20 mg daily.  LDL 166 on 05/11/2022.  Atorvastatin  increased to 80 mg daily.  LDL 88 on 07/18/2023.  Remains above LDL goal less than 70 given CAD as above, will add Zetia  10 mg daily  Snoring: Recommended sleep study, however patient declines at this time  Pulmonary nodule: Noted on coronary CTA 05/2022.  Recommend follow-up CT chest   RTC in 1 year     Medication Adjustments/Labs and Tests Ordered: Current medicines are reviewed at length with the patient today.  Concerns regarding medicines are outlined above.  Orders Placed This Encounter  Procedures   CT CHEST WO CONTRAST   Lipid panel   EKG 12-Lead    Meds ordered this encounter  Medications   ezetimibe  (ZETIA ) 10 MG tablet    Sig: Take 1 tablet (10 mg total) by mouth daily.    Dispense:  90 tablet    Refill:  3     Patient  Instructions  Medication Instructions:  Start Zetia  10 mg daily Continue all current medications *If you need a refill on your cardiac medications before your next appointment, please call your pharmacy*   Lab Work: Lipid Panel in 2 months. Please do not eat before this labwork. You can have water If you have labs (blood work) drawn today and your tests are completely normal, you will receive your results only by: MyChart Message (if you have MyChart) OR A paper copy in the mail If you have any lab test that is abnormal or we need to change your treatment, we will call you to review the results.   Testing/Procedures: Non-Cardiac CT scanning, (CAT scanning), is a noninvasive, special x-ray that produces cross-sectional images of the body using x-rays and a computer. CT scans help physicians diagnose and treat medical conditions. For some CT exams, a contrast material is used to enhance visibility in the area of the body being studied. CT scans provide greater clarity and reveal more details than regular x-ray exams.    Follow-Up: At [facility], you and your health needs are our priority.  As part of our continuing mission to provide you with exceptional heart care, we have created designated Provider Care Teams.  These Care Teams include your primary Cardiologist (physician) and Advanced Practice Providers (APPs -  Physician Assistants and Nurse Practitioners) who all work together to provide you with the care you need, when you need it.  We recommend signing up for the patient portal called MyChart.  Sign up information is provided on this After Visit Summary.  MyChart is used to connect with patients for Virtual Visits (Telemedicine).  Patients are able to view lab/test results, encounter notes, upcoming appointments, etc.  Non-urgent messages can be sent to your provider as well.   To learn more about what you can do with MyChart, go to [email or web address].    Your next  appointment:  1 year(s)  Provider:   Dr. Kate  Other Instructions .instct        Signed, Lonni LITTIE Kate, MD  08/16/2023 4:55 PM    [facility]

## 2023-08-16 ENCOUNTER — Encounter: Payer: Self-pay | Admitting: Cardiology

## 2023-08-16 ENCOUNTER — Ambulatory Visit: Payer: Medicare HMO | Attending: Cardiology | Admitting: Cardiology

## 2023-08-16 VITALS — BP 129/83 | HR 69 | Ht 64.0 in | Wt 203.0 lb

## 2023-08-16 DIAGNOSIS — I10 Essential (primary) hypertension: Secondary | ICD-10-CM | POA: Diagnosis not present

## 2023-08-16 DIAGNOSIS — E785 Hyperlipidemia, unspecified: Secondary | ICD-10-CM | POA: Diagnosis not present

## 2023-08-16 DIAGNOSIS — R911 Solitary pulmonary nodule: Secondary | ICD-10-CM | POA: Diagnosis not present

## 2023-08-16 DIAGNOSIS — I2510 Atherosclerotic heart disease of native coronary artery without angina pectoris: Secondary | ICD-10-CM | POA: Diagnosis not present

## 2023-08-16 MED ORDER — EZETIMIBE 10 MG PO TABS: 10.0000 mg | ORAL_TABLET | Freq: Every day | ORAL | 3 refills | Status: DC

## 2023-08-16 NOTE — Patient Instructions (Addendum)
 Medication Instructions:  Start Zetia  10 mg daily Continue all current medications *If you need a refill on your cardiac medications before your next appointment, please call your pharmacy*   Lab Work: Lipid Panel in 2 months. Please do not eat before this labwork. You can have water If you have labs (blood work) drawn today and your tests are completely normal, you will receive your results only by: MyChart Message (if you have MyChart) OR A paper copy in the mail If you have any lab test that is abnormal or we need to change your treatment, we will call you to review the results.   Testing/Procedures: Non-Cardiac CT scanning, (CAT scanning), is a noninvasive, special x-ray that produces cross-sectional images of the body using x-rays and a computer. CT scans help physicians diagnose and treat medical conditions. For some CT exams, a contrast material is used to enhance visibility in the area of the body being studied. CT scans provide greater clarity and reveal more details than regular x-ray exams.    Follow-Up: At [facility], you and your health needs are our priority.  As part of our continuing mission to provide you with exceptional heart care, we have created designated Provider Care Teams.  These Care Teams include your primary Cardiologist (physician) and Advanced Practice Providers (APPs -  Physician Assistants and Nurse Practitioners) who all work together to provide you with the care you need, when you need it.  We recommend signing up for the patient portal called MyChart.  Sign up information is provided on this After Visit Summary.  MyChart is used to connect with patients for Virtual Visits (Telemedicine).  Patients are able to view lab/test results, encounter notes, upcoming appointments, etc.  Non-urgent messages can be sent to your provider as well.   To learn more about what you can do with MyChart, go to [email or web address].    Your next appointment:   1  year(s)  Provider:   Dr. Kate  Other Instructions .instct

## 2023-08-23 ENCOUNTER — Ambulatory Visit (HOSPITAL_BASED_OUTPATIENT_CLINIC_OR_DEPARTMENT_OTHER): Payer: Medicare HMO

## 2023-08-24 ENCOUNTER — Ambulatory Visit (HOSPITAL_BASED_OUTPATIENT_CLINIC_OR_DEPARTMENT_OTHER)
Admission: RE | Admit: 2023-08-24 | Discharge: 2023-08-24 | Disposition: A | Discharge status: Home / Self Care | Payer: Medicare HMO | Source: Ambulatory Visit | Attending: Cardiology | Admitting: Cardiology

## 2023-08-24 DIAGNOSIS — I700 Atherosclerosis of aorta: Secondary | ICD-10-CM | POA: Diagnosis not present

## 2023-08-24 DIAGNOSIS — R911 Solitary pulmonary nodule: Secondary | ICD-10-CM | POA: Insufficient documentation

## 2023-09-03 ENCOUNTER — Encounter: Payer: Self-pay | Admitting: *Deleted

## 2023-10-12 ENCOUNTER — Encounter: Payer: Self-pay | Admitting: Pulmonary Disease

## 2023-11-14 DIAGNOSIS — R399 Unspecified symptoms and signs involving the genitourinary system: Secondary | ICD-10-CM | POA: Diagnosis not present

## 2023-11-29 DIAGNOSIS — D649 Anemia, unspecified: Secondary | ICD-10-CM | POA: Diagnosis not present

## 2023-11-30 DIAGNOSIS — M545 Low back pain, unspecified: Secondary | ICD-10-CM | POA: Diagnosis not present

## 2023-11-30 DIAGNOSIS — M791 Myalgia, unspecified site: Secondary | ICD-10-CM | POA: Diagnosis not present

## 2024-01-28 DIAGNOSIS — G894 Chronic pain syndrome: Secondary | ICD-10-CM | POA: Diagnosis not present

## 2024-01-28 DIAGNOSIS — Z6834 Body mass index (BMI) 34.0-34.9, adult: Secondary | ICD-10-CM | POA: Diagnosis not present

## 2024-01-28 DIAGNOSIS — I10 Essential (primary) hypertension: Secondary | ICD-10-CM | POA: Diagnosis not present

## 2024-01-28 DIAGNOSIS — E119 Type 2 diabetes mellitus without complications: Secondary | ICD-10-CM | POA: Diagnosis not present

## 2024-03-25 ENCOUNTER — Other Ambulatory Visit: Payer: Self-pay | Admitting: Cardiology

## 2024-04-18 DIAGNOSIS — H524 Presbyopia: Secondary | ICD-10-CM | POA: Diagnosis not present

## 2024-04-18 DIAGNOSIS — H35033 Hypertensive retinopathy, bilateral: Secondary | ICD-10-CM | POA: Diagnosis not present

## 2024-04-18 DIAGNOSIS — E119 Type 2 diabetes mellitus without complications: Secondary | ICD-10-CM | POA: Diagnosis not present

## 2024-05-02 DIAGNOSIS — K029 Dental caries, unspecified: Secondary | ICD-10-CM | POA: Diagnosis not present

## 2024-05-02 NOTE — Dental Procedure Details (Addendum)
 Nitrous Procedure Note   Start oxygen: 11:35 Start nitrous:  11.40 Stop nitrous: 11.58 Stop oxygen 12:06   Administered 100% Oxygen at 4 liters per min for 5 minutes prior to starting nitrous oxide. Nitrous titrated to 40% during procedure. At end of procedure nitrous oxide turned off and oxygen left on  for seven minutes.   Patient was released with no complications.

## 2024-05-15 NOTE — Progress Notes (Signed)
                                                Oral and Maxillofacial Surgery                                                            Post Op Note                                                        Dr. Mliss Catchings     S: Pt presents status post extraction of tooth #19. Pt presents by herself. Pt states she  had no pain in area and has stated she had a good experience after procedure. Pt has increased diet more but has had to eat more on the other side to keep food from getting in extraction site.   O: Physical exam shows one stitch remaining in  extraction site. Suture was removed. No exposed bone. No evidence of a dry socket.    A/P: Pt can slowly increase diet and eat what they are confirmable with . Will contact patient when her name gets close on waiting list for IV sedation. Pt to contact us  sooner if other issues arise.  Julie Lesnick DDS Oral and Maxillofacial Surgery Zane Molly DA II [facility] [place] [facility] [address], [place] [address] [place] [address] [phone]

## 2024-05-23 DIAGNOSIS — H43393 Other vitreous opacities, bilateral: Secondary | ICD-10-CM | POA: Diagnosis not present

## 2024-05-23 DIAGNOSIS — H5712 Ocular pain, left eye: Secondary | ICD-10-CM | POA: Diagnosis not present

## 2024-05-23 DIAGNOSIS — H53143 Visual discomfort, bilateral: Secondary | ICD-10-CM | POA: Diagnosis not present

## 2024-06-23 DIAGNOSIS — H52223 Regular astigmatism, bilateral: Secondary | ICD-10-CM | POA: Diagnosis not present

## 2024-06-23 DIAGNOSIS — H524 Presbyopia: Secondary | ICD-10-CM | POA: Diagnosis not present

## 2024-07-08 ENCOUNTER — Other Ambulatory Visit: Payer: Self-pay

## 2024-07-08 ENCOUNTER — Observation Stay (HOSPITAL_BASED_OUTPATIENT_CLINIC_OR_DEPARTMENT_OTHER)
Admission: EM | Admit: 2024-07-08 | Discharge: 2024-07-10 | DRG: 641 | Disposition: A | Attending: Internal Medicine | Admitting: Internal Medicine

## 2024-07-08 DIAGNOSIS — E876 Hypokalemia: Secondary | ICD-10-CM | POA: Diagnosis present

## 2024-07-08 DIAGNOSIS — R9431 Abnormal electrocardiogram [ECG] [EKG]: Secondary | ICD-10-CM | POA: Diagnosis present

## 2024-07-08 LAB — CBC
HCT: 35 % — ABNORMAL LOW (ref 36.0–46.0)
Hemoglobin: 11.6 g/dL — ABNORMAL LOW (ref 12.0–15.0)
MCH: 30.1 pg (ref 26.0–34.0)
MCHC: 33.1 g/dL (ref 30.0–36.0)
MCV: 90.9 fL (ref 80.0–100.0)
Platelets: 297 K/uL (ref 150–400)
RBC: 3.85 MIL/uL — ABNORMAL LOW (ref 3.87–5.11)
RDW: 15.2 % (ref 11.5–15.5)
WBC: 8.7 K/uL (ref 4.0–10.5)
nRBC: 0 % (ref 0.0–0.2)

## 2024-07-08 LAB — POTASSIUM: Potassium: 3.5 mmol/L (ref 3.5–5.1)

## 2024-07-08 LAB — PROTEIN, TOTAL: Total Protein: 6.7 g/dL (ref 6.5–8.1)

## 2024-07-08 LAB — BASIC METABOLIC PANEL WITH GFR
Anion gap: 14 (ref 5–15)
BUN: 12 mg/dL (ref 8–23)
CO2: 29 mmol/L (ref 22–32)
Calcium: 6.1 mg/dL — CL (ref 8.9–10.3)
Chloride: 100 mmol/L (ref 98–111)
Creatinine, Ser: 0.68 mg/dL (ref 0.44–1.00)
GFR, Estimated: >60 mL/min (ref >60)
Glucose, Bld: 109 mg/dL — ABNORMAL HIGH (ref 70–99)
Potassium: 3.2 mmol/L — ABNORMAL LOW (ref 3.5–5.1)
Sodium: 143 mmol/L (ref 135–145)

## 2024-07-08 LAB — MAGNESIUM
Magnesium: 0.5 mg/dL — CL (ref 1.7–2.4)
Magnesium: 1.4 mg/dL — ABNORMAL LOW (ref 1.7–2.4)

## 2024-07-08 LAB — ALBUMIN: Albumin: 4 g/dL (ref 3.5–5.0)

## 2024-07-08 LAB — PHOSPHORUS: Phosphorus: 2.9 mg/dL (ref 2.5–4.6)

## 2024-07-08 MED ORDER — LACTATED RINGERS IV BOLUS
1000.0000 mL | Freq: Once | INTRAVENOUS | Status: AC
  Administered 2024-07-08: 1000 mL via INTRAVENOUS

## 2024-07-08 MED ORDER — MAGNESIUM SULFATE 50 % IJ SOLN: 2.0000 g | Freq: Once | INTRAMUSCULAR | Status: DC

## 2024-07-08 MED ORDER — MAGNESIUM SULFATE 2 GM/50ML IV SOLN: 2.0000 g | Freq: Once | INTRAVENOUS | Status: DC

## 2024-07-08 MED ORDER — CALCIUM CHLORIDE 10 % IV SOLN
INTRAVENOUS | Status: AC
  Filled 2024-07-08: qty 10

## 2024-07-08 MED ORDER — POTASSIUM CHLORIDE CRYS ER 20 MEQ PO TBCR
40.0000 meq | EXTENDED_RELEASE_TABLET | Freq: Once | ORAL | Status: AC
  Administered 2024-07-08: 40 meq via ORAL
  Filled 2024-07-08: qty 2

## 2024-07-08 MED ORDER — MAGNESIUM SULFATE 2 GM/50ML IV SOLN
2.0000 g | Freq: Once | INTRAVENOUS | Status: AC
  Administered 2024-07-08: 2 g via INTRAVENOUS
  Filled 2024-07-08: qty 50

## 2024-07-08 MED ORDER — SODIUM CHLORIDE 0.9 % IV SOLN
1.0000 g | Freq: Once | INTRAVENOUS | Status: AC
  Administered 2024-07-08: 1 g via INTRAVENOUS
  Filled 2024-07-08: qty 10

## 2024-07-08 MED ORDER — MAGNESIUM SULFATE 4 GM/100ML IV SOLN: 4.0000 g | Freq: Once | INTRAVENOUS | Status: DC

## 2024-07-08 MED ORDER — MAGNESIUM OXIDE -MG SUPPLEMENT 400 (240 MG) MG PO TABS
800.0000 mg | ORAL_TABLET | Freq: Once | ORAL | Status: AC
  Administered 2024-07-08: 800 mg via ORAL
  Filled 2024-07-08: qty 2

## 2024-07-08 NOTE — ED Triage Notes (Signed)
 Pt POV reporting tingling and muscle cramps past few days, seen at [facility] PCP yesterday, called today with calcium  6.1, advised to be seen in ED.

## 2024-07-08 NOTE — Progress Notes (Signed)
 Patient was sent by [facility] physician to the emergency room for abnormal labs including hypocalcemia, she presented to PCP with diffuse tingling, numbness, ongoing for few days, and muscle spasms, this had episode of diarrhea yesterday, as well she is on hydrochlorothiazide , she had positive Trousseau sign in ED when her blood pressure was checked, she received 1 g of IV calcium  in ED, magnesium  and potassium is pending, another gram of IV calcium  was ordered, adding albumin, protein and ionized serum level, EKG was requested as well to check for QTc, patient accepted for OBS  to telemetry bed. Brayton Lye MD

## 2024-07-08 NOTE — ED Provider Notes (Signed)
 [facility]MENT AT [facility] Provider Note   CSN: [id number] Arrival date & time: 07/08/24  1506     Patient presents with: Abnormal Lab   Jamie Gilmore is a 78 y.o. female.   78 yo F with a chief complaints of diffuse tingling.  Going on for a few days.  She says she has muscle spasms at times especially when she goes to sit on the toilet.  Sometimes feels like she cannot get up because her legs are spasming so much.  She saw her doctor yesterday and they called her back this morning and told her that her calcium  was dangerously low and she needed to come to the ED for evaluation.  She denies any recent medication changes.  Had a couple episodes of diarrhea yesterday.  No known sick contacts.  No fever no abdominal pain no nausea or vomiting.  She feels like she has not been eating and drinking as well she could be.   Abnormal Lab      Prior to Admission medications   Medication Sig Start Date End Date Taking? Authorizing Provider  albuterol  (PROVENTIL  HFA;VENTOLIN  HFA) 108 (90 BASE) MCG/ACT inhaler Inhale 2 puffs into the lungs every 6 (six) hours as needed. Wheezing and shortness of breath Patient not taking: Reported on 08/16/2023    [provider]  ALPRAZolam (XANAX) 0.25 MG tablet Take 1 tablet by mouth daily as needed. 09/29/21   [provider]  amLODipine  (NORVASC ) 2.5 MG tablet Take 2.5 mg by mouth daily. 07/23/23   [provider]  aspirin  325 MG tablet 1 tablet    [provider]  atorvastatin  (LIPITOR) 80 MG tablet TAKE 1 TABLET BY MOUTH EVERY DAY 03/28/24   Kate Lonni CROME, MD  ezetimibe  (ZETIA ) 10 MG tablet Take 1 tablet (10 mg total) by mouth daily. 08/16/23 11/14/23  Kate Lonni CROME, MD  FLUoxetine  (PROZAC ) 40 MG capsule  02/26/15   [provider]  gabapentin (NEURONTIN) 300 MG capsule Take 300 mg by mouth at bedtime.    [provider]  metFORMIN (GLUCOPHAGE) 500 MG tablet  Take 500 mg by mouth daily with breakfast. Patient not taking: Reported on 08/16/2023    [provider]  metFORMIN (GLUCOPHAGE-XR) 500 MG 24 hr tablet Take 1 tablet by mouth daily. 09/16/21   [provider]  metoprolol  tartrate (LOPRESSOR ) 50 MG tablet Take 50 mg (1 tablet) TWO hours prior to CT scan 05/11/22   Kate Lonni CROME, MD  pantoprazole  (PROTONIX ) 40 MG tablet Take 1 tablet by mouth daily.    [provider]  Potassium Chloride  ER 20 MEQ TBCR Take 1 tablet by mouth daily.    [provider]  Tiotropium Bromide-Olodaterol (STIOLTO RESPIMAT ) 2.5-2.5 MCG/ACT AERS Inhale 2 puffs into the lungs daily. Patient not taking: Reported on 08/16/2023 10/07/21   Kara Dorn NOVAK, MD  traMADol (ULTRAM) 50 MG tablet Take 50 mg by mouth every 6 (six) hours as needed for moderate pain.    [provider]  traZODone  (DESYREL ) 50 MG tablet Take 1 tablet (50 mg total) by mouth at bedtime. 04/06/15   Rama, Tawni SQUIBB, MD    Allergies: Lisinopril    Review of Systems  Updated Vital Signs BP (!) 151/76   Pulse (!) 55   Temp 98.1 F (36.7 C) (Oral)   Resp 17   Ht 5' 5 (1.651 m)   Wt 90.7 kg   SpO2 97%   BMI 33.28 kg/m  Physical Exam Vitals and nursing note reviewed.  Constitutional:      General: She is not in acute distress.    Appearance: She is well-developed. She is not diaphoretic.  HENT:     Head: Normocephalic and atraumatic.  Eyes:     Pupils: Pupils are equal, round, and reactive to light.  Cardiovascular:     Rate and Rhythm: Normal rate and regular rhythm.     Heart sounds: No murmur heard.    No friction rub. No gallop.  Pulmonary:     Effort: Pulmonary effort is normal.     Breath sounds: No wheezing or rales.  Abdominal:     General: There is no distension.     Palpations: Abdomen is soft.     Tenderness: There is no abdominal tenderness.  Musculoskeletal:        General: No tenderness.     Cervical back: Normal range  of motion and neck supple.  Skin:    General: Skin is warm and dry.  Neurological:     Mental Status: She is alert and oriented to person, place, and time.  Psychiatric:        Behavior: Behavior normal.     (all labs ordered are listed, but only abnormal results are displayed) Labs Reviewed  CBC - Abnormal; Notable for the following components:      Result Value   RBC 3.85 (*)    Hemoglobin 11.6 (*)    HCT 35.0 (*)    All other components within normal limits  BASIC METABOLIC PANEL WITH GFR - Abnormal; Notable for the following components:   Potassium 3.2 (*)    Glucose, Bld 109 (*)    Calcium  6.1 (*)    All other components within normal limits  MAGNESIUM  - Abnormal; Notable for the following components:   Magnesium  0.5 (*)    All other components within normal limits  PHOSPHORUS  PROTEIN, TOTAL  ALBUMIN  CALCIUM , IONIZED    EKG: EKG Interpretation Date/Time:  Tuesday July 08 2024 16:53:36 EST Ventricular Rate:  66 PR Interval:  166 QRS Duration:  108 QT Interval:  467 QTC Calculation: 490 R Axis:   52  Text Interpretation: Sinus rhythm Abnormal R-wave progression, early transition Borderline prolonged QT interval No significant change since last tracing Confirmed by Emil Share [phone]) on 07/08/2024 5:30:10 PM  Radiology: No results found.   .Critical Care  Performed by: Emil Share, DO Authorized by: Emil Share, DO   Critical care provider statement:    Critical care time (minutes):  35   Critical care time was exclusive of:  Separately billable procedures and treating other patients   Critical care was time spent personally by me on the following activities:  Development of treatment plan with patient or surrogate, discussions with consultants, evaluation of patient's response to treatment, examination of patient, ordering and review of laboratory studies, ordering and review of radiographic studies, ordering and performing treatments and interventions,  pulse oximetry, re-evaluation of patient's condition and review of old charts   Care discussed with: admitting provider      Medications Ordered in the ED  calcium  chloride 10 % injection (  Not Given 07/08/24 1631)  magnesium  sulfate IVPB 2 g 50 mL (2 g Intravenous New Bag/Given 07/08/24 1846)  calcium  chloride 10 % injection (  Not Given 07/08/24 1753)  magnesium  oxide (MAG-OX) tablet 800 mg (800 mg Oral Given 07/08/24 1623)  calcium  chloride 1 g in sodium chloride  0.9 % 100  mL IVPB (0 g Intravenous Stopped 07/08/24 1736)  lactated ringers  bolus 1,000 mL (0 mLs Intravenous Stopped 07/08/24 1806)  potassium chloride  SA (KLOR-CON  M) CR tablet 40 mEq (40 mEq Oral Given 07/08/24 1623)  calcium  chloride 1 g in sodium chloride  0.9 % 100 mL IVPB (0 g Intravenous Stopped 07/08/24 1844)                                    Medical Decision Making Amount and/or Complexity of Data Reviewed Labs: ordered. ECG/medicine tests: ordered.  Risk OTC drugs. Prescription drug management. Decision regarding hospitalization.   78 yo F with a chief complaints of tingling all over her body.  Was seen by her doctor yesterday and found to have a calcium  of 6.1.  Looks like her corrected calcium  was 6.0.  She is symptomatic.  Had a positive Trousseau's sign when her blood pressure was checked.  Will start IV give IV calcium .  Check mag.  Will give K and mag orally.  Will discuss with medicine for admission.  The patients results and plan were reviewed and discussed.   Any x-rays performed were independently reviewed by myself.   Differential diagnosis were considered with the presenting HPI.  Medications  calcium  chloride 10 % injection (  Not Given 07/08/24 1631)  magnesium  sulfate IVPB 2 g 50 mL (2 g Intravenous New Bag/Given 07/08/24 1846)  calcium  chloride 10 % injection (  Not Given 07/08/24 1753)  magnesium  oxide (MAG-OX) tablet 800 mg (800 mg Oral Given 07/08/24 1623)  calcium  chloride 1 g in sodium  chloride 0.9 % 100 mL IVPB (0 g Intravenous Stopped 07/08/24 1736)  lactated ringers  bolus 1,000 mL (0 mLs Intravenous Stopped 07/08/24 1806)  potassium chloride  SA (KLOR-CON  M) CR tablet 40 mEq (40 mEq Oral Given 07/08/24 1623)  calcium  chloride 1 g in sodium chloride  0.9 % 100 mL IVPB (0 g Intravenous Stopped 07/08/24 1844)    Vitals:   07/08/24 1519 07/08/24 1830 07/08/24 1847 07/08/24 1920  BP: (!) 113/96 (!) 169/139 (!) 151/76   Pulse: 81 70 (!) 55   Resp: 17 12 17    Temp: 97.8 F (36.6 C)   98.1 F (36.7 C)  TempSrc: Oral   Oral  SpO2: 100% 96% 97%   Weight: 90.7 kg     Height: 5' 5 (1.651 m)       Final diagnoses:  Hypocalcemia  Hypomagnesemia    Admission/ observation were discussed with the admitting physician, patient and/or family and they are comfortable with the plan.       Final diagnoses:  Hypocalcemia  Hypomagnesemia    ED Discharge Orders     None          Emil Share, [place] 07/08/24 1933

## 2024-07-09 DIAGNOSIS — Z86718 Personal history of other venous thrombosis and embolism: Secondary | ICD-10-CM | POA: Diagnosis not present

## 2024-07-09 DIAGNOSIS — I10 Essential (primary) hypertension: Secondary | ICD-10-CM | POA: Diagnosis not present

## 2024-07-09 DIAGNOSIS — Z87891 Personal history of nicotine dependence: Secondary | ICD-10-CM | POA: Diagnosis not present

## 2024-07-09 DIAGNOSIS — R9431 Abnormal electrocardiogram [ECG] [EKG]: Secondary | ICD-10-CM | POA: Diagnosis not present

## 2024-07-09 DIAGNOSIS — E119 Type 2 diabetes mellitus without complications: Secondary | ICD-10-CM

## 2024-07-09 DIAGNOSIS — E66811 Obesity, class 1: Secondary | ICD-10-CM | POA: Diagnosis not present

## 2024-07-09 DIAGNOSIS — Z79899 Other long term (current) drug therapy: Secondary | ICD-10-CM | POA: Diagnosis not present

## 2024-07-09 DIAGNOSIS — Z7982 Long term (current) use of aspirin: Secondary | ICD-10-CM | POA: Diagnosis not present

## 2024-07-09 DIAGNOSIS — Z7984 Long term (current) use of oral hypoglycemic drugs: Secondary | ICD-10-CM | POA: Diagnosis not present

## 2024-07-09 DIAGNOSIS — Z833 Family history of diabetes mellitus: Secondary | ICD-10-CM | POA: Diagnosis not present

## 2024-07-09 DIAGNOSIS — E1151 Type 2 diabetes mellitus with diabetic peripheral angiopathy without gangrene: Secondary | ICD-10-CM | POA: Diagnosis not present

## 2024-07-09 DIAGNOSIS — E876 Hypokalemia: Secondary | ICD-10-CM | POA: Diagnosis not present

## 2024-07-09 DIAGNOSIS — Z6833 Body mass index (BMI) 33.0-33.9, adult: Secondary | ICD-10-CM | POA: Diagnosis not present

## 2024-07-09 DIAGNOSIS — Z96642 Presence of left artificial hip joint: Secondary | ICD-10-CM | POA: Diagnosis not present

## 2024-07-09 DIAGNOSIS — K219 Gastro-esophageal reflux disease without esophagitis: Secondary | ICD-10-CM | POA: Diagnosis not present

## 2024-07-09 DIAGNOSIS — E8809 Other disorders of plasma-protein metabolism, not elsewhere classified: Secondary | ICD-10-CM | POA: Diagnosis not present

## 2024-07-09 LAB — COMPREHENSIVE METABOLIC PANEL WITH GFR
ALT: 14 U/L (ref 0–44)
ALT: 13 U/L (ref 0–44)
AST: 19 U/L (ref 15–41)
AST: 17 U/L (ref 15–41)
Albumin: 3.1 g/dL — ABNORMAL LOW (ref 3.5–5.0)
Albumin: 3 g/dL — ABNORMAL LOW (ref 3.5–5.0)
Alkaline Phosphatase: 57 U/L (ref 38–126)
Alkaline Phosphatase: 58 U/L (ref 38–126)
Anion gap: 14 (ref 5–15)
Anion gap: 9 (ref 5–15)
BUN: <5 mg/dL — ABNORMAL LOW (ref 8–23)
BUN: 7 mg/dL — ABNORMAL LOW (ref 8–23)
CO2: 29 mmol/L (ref 22–32)
CO2: 30 mmol/L (ref 22–32)
Calcium: 7.7 mg/dL — ABNORMAL LOW (ref 8.9–10.3)
Calcium: 7.8 mg/dL — ABNORMAL LOW (ref 8.9–10.3)
Chloride: 99 mmol/L (ref 98–111)
Chloride: 102 mmol/L (ref 98–111)
Creatinine, Ser: 0.71 mg/dL (ref 0.44–1.00)
Creatinine, Ser: 0.77 mg/dL (ref 0.44–1.00)
GFR, Estimated: >60 mL/min (ref >60)
GFR, Estimated: >60 mL/min (ref >60)
Glucose, Bld: 94 mg/dL (ref 70–99)
Glucose, Bld: 106 mg/dL — ABNORMAL HIGH (ref 70–99)
Potassium: 3.5 mmol/L (ref 3.5–5.1)
Potassium: 3.7 mmol/L (ref 3.5–5.1)
Sodium: 142 mmol/L (ref 135–145)
Sodium: 141 mmol/L (ref 135–145)
Total Bilirubin: 0.8 mg/dL (ref 0.0–1.2)
Total Bilirubin: 0.6 mg/dL (ref 0.0–1.2)
Total Protein: 5.8 g/dL — ABNORMAL LOW (ref 6.5–8.1)
Total Protein: 5.9 g/dL — ABNORMAL LOW (ref 6.5–8.1)

## 2024-07-09 LAB — VITAMIN D 25 HYDROXY (VIT D DEFICIENCY, FRACTURES): Vit D, 25-Hydroxy: 52.38 ng/mL (ref 30–100)

## 2024-07-09 LAB — GLUCOSE, CAPILLARY
Glucose-Capillary: 92 mg/dL (ref 70–99)
Glucose-Capillary: 93 mg/dL (ref 70–99)
Glucose-Capillary: 93 mg/dL (ref 70–99)
Glucose-Capillary: 126 mg/dL — ABNORMAL HIGH (ref 70–99)

## 2024-07-09 LAB — HEMOGLOBIN A1C
Hgb A1c MFr Bld: 5.3 % (ref 4.8–5.6)
Mean Plasma Glucose: 105 mg/dL

## 2024-07-09 LAB — CBC
HCT: 33.7 % — ABNORMAL LOW (ref 36.0–46.0)
Hemoglobin: 11.4 g/dL — ABNORMAL LOW (ref 12.0–15.0)
MCH: 30.4 pg (ref 26.0–34.0)
MCHC: 33.8 g/dL (ref 30.0–36.0)
MCV: 89.9 fL (ref 80.0–100.0)
Platelets: 296 K/uL (ref 150–400)
RBC: 3.75 MIL/uL — ABNORMAL LOW (ref 3.87–5.11)
RDW: 15.2 % (ref 11.5–15.5)
WBC: 7.2 K/uL (ref 4.0–10.5)
nRBC: 0 % (ref 0.0–0.2)

## 2024-07-09 LAB — MAGNESIUM: Magnesium: 2.5 mg/dL — ABNORMAL HIGH (ref 1.7–2.4)

## 2024-07-09 MED ORDER — CALCIUM GLUCONATE-NACL 1-0.675 GM/50ML-% IV SOLN
1.0000 g | Freq: Once | INTRAVENOUS | Status: AC
  Administered 2024-07-09: 1000 mg via INTRAVENOUS
  Filled 2024-07-09: qty 50

## 2024-07-09 MED ORDER — POTASSIUM CHLORIDE 10 MEQ/100ML IV SOLN
10.0000 meq | INTRAVENOUS | Status: AC
  Administered 2024-07-09 (×2): 10 meq via INTRAVENOUS
  Filled 2024-07-09: qty 100

## 2024-07-09 MED ORDER — POTASSIUM CHLORIDE 10 MEQ/100ML IV SOLN
10.0000 meq | INTRAVENOUS | Status: DC
  Filled 2024-07-09: qty 100

## 2024-07-09 MED ORDER — ACETAMINOPHEN 325 MG PO TABS
650.0000 mg | ORAL_TABLET | Freq: Four times a day (QID) | ORAL | Status: DC | PRN
  Administered 2024-07-09: 650 mg via ORAL
  Filled 2024-07-09: qty 2

## 2024-07-09 MED ORDER — INSULIN ASPART 100 UNIT/ML IJ SOLN: 0.0000 [IU] | Freq: Three times a day (TID) | INTRAMUSCULAR | Status: DC

## 2024-07-09 MED ORDER — SODIUM CHLORIDE 0.9% FLUSH
3.0000 mL | Freq: Two times a day (BID) | INTRAVENOUS | Status: DC
  Administered 2024-07-09 – 2024-07-10 (×3): 3 mL via INTRAVENOUS

## 2024-07-09 MED ORDER — MAGNESIUM SULFATE 2 GM/50ML IV SOLN
2.0000 g | Freq: Once | INTRAVENOUS | Status: AC
  Administered 2024-07-09: 2 g via INTRAVENOUS
  Filled 2024-07-09: qty 50

## 2024-07-09 MED ORDER — ORAL CARE MOUTH RINSE: 15.0000 mL | OROMUCOSAL | Status: DC | PRN

## 2024-07-09 MED ORDER — BISACODYL 5 MG PO TBEC: 5.0000 mg | DELAYED_RELEASE_TABLET | Freq: Every day | ORAL | Status: DC | PRN

## 2024-07-09 NOTE — H&P (Signed)
 History and Physical    Patient: Jamie Gilmore FMW:[id number] DOB: Nov 24, 1945 DOA: 07/08/2024 DOS: the patient was seen and examined on 07/09/2024 PCP: Kip Righter, MD  Patient coming from: Home  Chief Complaint:  Chief Complaint  Patient presents with   Abnormal Lab   HPI: Jamie Gilmore is a 78 y.o. female with medical history significant for hypertension and DVT following knee replacement who presented to her primary care physician with complaints of tingling all over and muscle cramps for couple days.  She has had this happen once years ago and it was due to low electrolytes.  She was seen in the clinic and had blood drawn then went home.  Today she was called and told to go to the emergency department because her calcium  was dangerously low at 6.1.  The patient denies any nausea or headache or change in her baseline shortness of breath.  She was not lightheaded or dizzy.  Last night the patient had severe diarrhea all night long.  It resolved once daylight came.  The patient did present to the emergency department as instructed and was found today to not only have a low calcium  but also a low magnesium  and potassium. She received IV calcium  carbonate in the emergency department.  By the time of my evaluation the patient says all of her tingling is gone.  She feels very tired and has some congestion but no fevers or chills.  No chest pain or any diarrhea.  She reports that she takes calcium  supplementation, magnesium  supplementation and vitamin D supplementation as an outpatient.   Review of Systems: As mentioned in the history of present illness. All other systems reviewed and are negative. Past Medical History:  Diagnosis Date   Acute blood loss anemia 08/01/2012   Arthritis    Depression    Elevated blood sugar    borderline diabetes   GERD (gastroesophageal reflux disease)    Headache(784.0)    history migraines   Hypertension    Peripheral vascular disease 05/2010   DVT  post op knee replacement   Shortness of breath    all my life- rarely use   Past Surgical History:  Procedure Laterality Date   CARPAL TUNNEL RELEASE     left   JOINT REPLACEMENT  05/2010   left knee   RECTOVAGINAL FISTULA CLOSURE     TONSILLECTOMY     TOTAL HIP ARTHROPLASTY  07/29/2012   Procedure: TOTAL HIP ARTHROPLASTY;  Surgeon: Tanda DELENA Heading, MD;  Location: [facility];  Service: Orthopedics;  Laterality: Left;   TOTAL KNEE ARTHROPLASTY     Social History:  reports that she quit smoking about 7 years ago. Her smoking use included cigarettes. She has never used smokeless tobacco. She reports current alcohol use. She reports that she does not use drugs.  Allergies  Allergen Reactions   Lisinopril Cough    Family History  Problem Relation Age of Onset   Diabetes Mother    Parkinson's disease Mother    Asthma Father    Tuberculosis Father     Prior to Admission medications   Medication Sig Start Date End Date Taking? Authorizing Provider  albuterol  (PROVENTIL  HFA;VENTOLIN  HFA) 108 (90 BASE) MCG/ACT inhaler Inhale 2 puffs into the lungs every 6 (six) hours as needed. Wheezing and shortness of breath Patient not taking: Reported on 08/16/2023    [provider]  ALPRAZolam (XANAX) 0.25 MG tablet Take 1 tablet by mouth daily as needed. 09/29/21   [provider]  amLODipine  (NORVASC ) 2.5 MG tablet Take 2.5 mg by mouth daily. 07/23/23   [provider]  aspirin  325 MG tablet 1 tablet    [provider]  atorvastatin  (LIPITOR) 80 MG tablet TAKE 1 TABLET BY MOUTH EVERY DAY 03/28/24   Kate Lonni CROME, MD  ezetimibe  (ZETIA ) 10 MG tablet Take 1 tablet (10 mg total) by mouth daily. 08/16/23 11/14/23  Kate Lonni CROME, MD  FLUoxetine  (PROZAC ) 40 MG capsule  02/26/15   [provider]  gabapentin (NEURONTIN) 300 MG capsule Take 300 mg by mouth at bedtime.    [provider]  metFORMIN (GLUCOPHAGE) 500 MG tablet Take 500 mg by  mouth daily with breakfast. Patient not taking: Reported on 08/16/2023    [provider]  metFORMIN (GLUCOPHAGE-XR) 500 MG 24 hr tablet Take 1 tablet by mouth daily. 09/16/21   [provider]  metoprolol  tartrate (LOPRESSOR ) 50 MG tablet Take 50 mg (1 tablet) TWO hours prior to CT scan 05/11/22   Kate Lonni CROME, MD  pantoprazole  (PROTONIX ) 40 MG tablet Take 1 tablet by mouth daily.    [provider]  Potassium Chloride  ER 20 MEQ TBCR Take 1 tablet by mouth daily.    [provider]  Tiotropium Bromide-Olodaterol (STIOLTO RESPIMAT ) 2.5-2.5 MCG/ACT AERS Inhale 2 puffs into the lungs daily. Patient not taking: Reported on 08/16/2023 10/07/21   Kara Dorn NOVAK, MD  traMADol (ULTRAM) 50 MG tablet Take 50 mg by mouth every 6 (six) hours as needed for moderate pain.    [provider]  traZODone  (DESYREL ) 50 MG tablet Take 1 tablet (50 mg total) by mouth at bedtime. 04/06/15   Rama, Tawni SQUIBB, MD    Physical Exam: Vitals:   07/08/24 1920 07/08/24 1930 07/08/24 2052 07/08/24 2139  BP:  (!) 152/92 (!) 152/77 (!) 153/65  Pulse:  63 67 69  Resp:  17    Temp: 98.1 F (36.7 C)   99 F (37.2 C)  TempSrc: Oral   Oral  SpO2:  98% 97% 97%  Weight:      Height:       Physical Exam:  General: No acute distress, well developed, well nourished HEENT: Normocephalic, atraumatic, PERRL Cardiovascular: Normal rate and rhythm. Distal pulses intact. Pulmonary: Normal pulmonary effort, normal breath sounds Gastrointestinal: Nondistended abdomen, soft, non-tender, normoactive bowel sounds, no organomegaly Musculoskeletal:Normal ROM, no lower ext edema Lymphadenopathy: No cervical LAD. Skin: Skin is warm and dry. Neuro: No focal deficits noted, AAOx3. PSYCH: Attentive and cooperative  Data Reviewed:  Results for orders placed or performed during the hospital encounter of 07/08/24 (from the past 24 hours)  CBC     Status: Abnormal   Collection Time:  07/08/24  3:25 PM  Result Value Ref Range   WBC 8.7 4.0 - 10.5 K/uL   RBC 3.85 (L) 3.87 - 5.11 MIL/uL   Hemoglobin 11.6 (L) 12.0 - 15.0 g/dL   HCT 64.9 (L) 63.9 - 53.9 %   MCV 90.9 80.0 - 100.0 fL   MCH 30.1 26.0 - 34.0 pg   MCHC 33.1 30.0 - 36.0 g/dL   RDW 84.7 88.4 - 84.4 %   Platelets 297 150 - 400 K/uL   nRBC 0.0 0.0 - 0.2 %  Basic metabolic panel     Status: Abnormal   Collection Time: 07/08/24  3:25 PM  Result Value Ref Range   Sodium 143 135 - 145 mmol/L   Potassium 3.2 (L) 3.5 - 5.1 mmol/L  Chloride 100 98 - 111 mmol/L   CO2 29 22 - 32 mmol/L   Glucose, Bld 109 (H) 70 - 99 mg/dL   BUN 12 8 - 23 mg/dL   Creatinine, Ser 9.31 0.44 - 1.00 mg/dL   Calcium  6.1 (LL) 8.9 - 10.3 mg/dL   GFR, Estimated >39 >39 mL/min   Anion gap 14 5 - 15  Magnesium      Status: Abnormal   Collection Time: 07/08/24  3:25 PM  Result Value Ref Range   Magnesium  0.5 (LL) 1.7 - 2.4 mg/dL  Phosphorus     Status: None   Collection Time: 07/08/24  3:25 PM  Result Value Ref Range   Phosphorus 2.9 2.5 - 4.6 mg/dL  Protein, total     Status: None   Collection Time: 07/08/24  4:39 PM  Result Value Ref Range   Total Protein 6.7 6.5 - 8.1 g/dL  Albumin     Status: None   Collection Time: 07/08/24  4:39 PM  Result Value Ref Range   Albumin 4.0 3.5 - 5.0 g/dL  Potassium     Status: None   Collection Time: 07/08/24 11:08 PM  Result Value Ref Range   Potassium 3.5 3.5 - 5.1 mmol/L  Magnesium      Status: Abnormal   Collection Time: 07/08/24 11:08 PM  Result Value Ref Range   Magnesium  1.4 (L) 1.7 - 2.4 mg/dL     Assessment and Plan: Symptomatic hypocalcemia - cause is unclear. - Check vitamin D  level and PTH.  The patient is on chronic treatment for vitamin D  deficiency.  Hypomagnesemia can cause hypocalcemia but the patient is on magnesium  supplementation and The patient's magnesium  is thought to be low because of her diarrhea last night which was after her low calcium  level from yesterday. -  Replete  Hypokalemia - likely due to diarrhea - Replete  3.  Hypomagnesemia - likely due to diarrhea - Replete  4. DMT2 -hold metformin.   - Monitor blood sugars - Corrective dose insulin  as needed     Advance Care Planning:   Code Status: Prior  The patient names her oldest daughter as a runner, broadcasting/film/video and she wants to be full code.  Consults: None  Family Communication: None  Severity of Illness: The appropriate patient status for this patient is INPATIENT. Inpatient status is judged to be reasonable and necessary in order to provide the required intensity of service to ensure the patient's safety. The patient's presenting symptoms, physical exam findings, and initial radiographic and laboratory data in the context of their chronic comorbidities is felt to place them at high risk for further clinical deterioration. Furthermore, it is not anticipated that the patient will be medically stable for discharge from the hospital within 2 midnights of admission.   * I certify that at the point of admission it is my clinical judgment that the patient will require inpatient hospital care spanning beyond 2 midnights from the point of admission due to high intensity of service, high risk for further deterioration and high frequency of surveillance required.*  Author: ARTHEA CHILD, MD 07/09/2024 12:15 AM  For on call review [email or web address].

## 2024-07-09 NOTE — TOC CM/SW Note (Signed)
 Transition of Care [facility]) - Inpatient Brief Assessment   Patient Details  Name: Jamie Gilmore MRN: [id number] Date of Birth: [birth year]  Transition of Care [facility]) CM/SW Contact:    Tom-Johnson, Harvest Muskrat, RN Phone Number: 07/09/2024, 3:15 PM   Clinical Narrative:  Patient presented to the ED with Tingling and Muscle Cramps. Admitted with Hypocalcemia, Calcium  at 6.1.  CM spoke with patient at bedside about needs for post hospital transition. Patient states she lives with her Significant other, Ozell. Has two supportive children who lives in [place] . Modified independent, Ozell drives to and from appointments. Has a cane, walker, rollator, shower seat and rails at home.  PCP is Kip Righter, MD and uses [facility] on [address] [facility] [address].   No ICM needs or recommendations noted at this time.  Patient not Medically ready for discharge.  CM will continue to follow as patient progresses with care towards discharge.            Transition of Care Asessment: Insurance and Status: Insurance coverage has been reviewed Patient has primary care physician: Yes Home environment has been reviewed: Yes Prior level of function:: Modified Independent Prior/Current Home Services: No current home services Social Drivers of Health Review: SDOH reviewed no interventions necessary Readmission risk has been reviewed: Yes Transition of care needs: no transition of care needs at this time

## 2024-07-09 NOTE — Progress Notes (Signed)
 Consultation Progress Note   Patient: Jamie Gilmore FMW:[id number] DOB: March 27, 1946 DOA: 07/08/2024 DOS: the patient was seen and examined on 07/09/2024 Primary service: Fujie Dickison, Landon BRAVO, MD  Brief hospital course:  Jamie Gilmore is a 78 y.o. female with medical history significant for hypertension and DVT following knee replacement who presented to her primary care physician with complaints of tingling all over and muscle cramps for couple days.  She has had this happen once years ago and it was due to low electrolytes.  She was seen in the clinic and had blood drawn then went home.  Today she was called and told to go to the emergency department because her calcium  was dangerously low at 6.1.     The patient did present to the emergency department as instructed and was found to not only have a low calcium  but also a low magnesium  and potassium. She received IV calcium  carbonate in the emergency department.  By the time of my evaluation the patient says all of her tingling is gone.  She feels very tired and has some congestion but no fevers or chills.  No chest pain or any diarrhea.  She reports that she takes calcium  supplementation, magnesium  supplementation and vitamin D supplementation as an outpatient. 12/3-trend lites replaced however suboptimally improved.  Mg and calcium  remain low.  Assessment and Plan: Symptomatic hypocalcemia -  Unclear etiology Normal  vitamin D level-52.38 PTH is pending Ordered 2 g of magnesium  and 1 g of calcium  this morning. Repeat labs to check calcium  level and magnesium  level.  The patient is on chronic treatment for vitamin D deficiency.  Hypomagnesemia can cause hypocalcemia but the patient is on magnesium  supplementation Await PTH levels.   Hypokalemia - likely due to diarrhea - Replete   3.  Hypomagnesemia - likely due to diarrhea - Replete   4. DMT2 -hold metformin.   - Monitor blood sugars - Corrective dose insulin as needed          TRH will continue to follow the patient.  Subjective: Patient seen at bedside this morning, states that she feels better this morning and states that she would like to be discharged.  Refused taking calcium  supplementation initially she stated that she did not have a good night sleep and would like to go home.  Physical Exam: Vitals:   07/08/24 2139 07/09/24 0000 07/09/24 0515 07/09/24 0809  BP: (!) 153/65  135/72 107/87  Pulse: 69  69 88  Resp: 16  14 18   Temp: 99 F (37.2 C)  98.4 F (36.9 C)   TempSrc: Oral  Oral   SpO2: 97%  96% 96%  Weight:  91.4 kg    Height:  5' 5 (1.651 m)     General  No acute Distress Eyes: PERRL, lids and conjunctivae normal ENMT: Mucous membranes are moist.   Neck: normal, supple, no masses, no thyromegaly Respiratory: clear to auscultation bilaterally, no wheezing, no crackles. Normal respiratory effort. No accessory muscle use.  Cardiovascular: Regular rate and rhythm, no murmurs / rubs / gallops Abdomen: Soft, nontender nondistended. Musculoskeletal: no clubbing / cyanosis. No joint deformity upper and lower extremities.  Skin: no rashes, lesions, ulcers. No induration Neurologic: Facial asymmetry, moving extremity spontaneously, speech fluent. Psychiatric: Normal judgment and insight. Alert and oriented x 3. Normal mood.   Data Reviewed:    CBC    Component Value Date/Time   WBC 7.2 07/09/2024 0551   RBC 3.75 (L) 07/09/2024 0551   HGB 11.4 (  L) 07/09/2024 0551   HCT 33.7 (L) 07/09/2024 0551   PLT 296 07/09/2024 0551   MCV 89.9 07/09/2024 0551   MCH 30.4 07/09/2024 0551   MCHC 33.8 07/09/2024 0551   RDW 15.2 07/09/2024 0551   LYMPHSABS 2.4 04/05/2015 0740   MONOABS 0.6 04/05/2015 0740   EOSABS 0.1 04/05/2015 0740   BASOSABS 0.0 04/05/2015 0740   CMP     Component Value Date/Time   NA 142 07/09/2024 0551   NA 142 05/11/2022 1552   K 3.5 07/09/2024 0551   CL 99 07/09/2024 0551   CO2 29 07/09/2024 0551   GLUCOSE 94  07/09/2024 0551   BUN <5 (L) 07/09/2024 0551   BUN 13 05/11/2022 1552   CREATININE 0.71 07/09/2024 0551   CALCIUM  7.7 (L) 07/09/2024 0551   PROT 5.8 (L) 07/09/2024 0551   ALBUMIN 3.1 (L) 07/09/2024 0551   AST 19 07/09/2024 0551   ALT 14 07/09/2024 0551   ALKPHOS 57 07/09/2024 0551   BILITOT 0.8 07/09/2024 0551   EGFR 67 05/11/2022 1552   GFRNONAA >60 07/09/2024 0551    Family Communication: Husband at bedside  Time spent: 34 minutes.  Author: Landon FORBES Baller, MD 07/09/2024 1:03 PM  For on call review [email or web address].

## 2024-07-09 NOTE — Progress Notes (Signed)
 New Admission Note:  Arrival Method: By carelink around this time  Mental Orientation: Alert and oriented x 4 Telemetry: Box 11, CCMD notified Assessment: Completed Skin: Completed, refer to flowsheets IV: L AC S.L. Pain: Denies Tubes: None Safety Measures: Safety Fall Prevention Plan was given, discussed and signed. Admission: Completed [facility] Orientation: Patient has been orientated to the room, unit and the staff. Family: None  Orders have been reviewed and implemented. Will continue to monitor the patient. Call light has been placed within reach and bed alarm has been activated.   Bari Lor, RN  Phone Number: [phone]

## 2024-07-09 NOTE — Significant Event (Signed)
 Hospitalist cross cover   Repeat potassium and magnesium  are still suboptimal. Repeat calcium  has been ordered twice but for some reason that is not resulting.  Plan - Continue repletion of potassium and magnesium . -Will try to figure out why the calcium  levels ordered are not resulting.

## 2024-07-10 ENCOUNTER — Other Ambulatory Visit (HOSPITAL_COMMUNITY): Payer: Self-pay

## 2024-07-10 DIAGNOSIS — R9431 Abnormal electrocardiogram [ECG] [EKG]: Secondary | ICD-10-CM | POA: Diagnosis not present

## 2024-07-10 DIAGNOSIS — E876 Hypokalemia: Secondary | ICD-10-CM | POA: Diagnosis not present

## 2024-07-10 LAB — COMPREHENSIVE METABOLIC PANEL WITH GFR
ALT: 12 U/L (ref 0–44)
AST: 16 U/L (ref 15–41)
Albumin: 2.9 g/dL — ABNORMAL LOW (ref 3.5–5.0)
Alkaline Phosphatase: 51 U/L (ref 38–126)
Anion gap: 9 (ref 5–15)
BUN: 10 mg/dL (ref 8–23)
CO2: 28 mmol/L (ref 22–32)
Calcium: 7.7 mg/dL — ABNORMAL LOW (ref 8.9–10.3)
Chloride: 104 mmol/L (ref 98–111)
Creatinine, Ser: 0.71 mg/dL (ref 0.44–1.00)
GFR, Estimated: >60 mL/min (ref >60)
Glucose, Bld: 110 mg/dL — ABNORMAL HIGH (ref 70–99)
Potassium: 4 mmol/L (ref 3.5–5.1)
Sodium: 141 mmol/L (ref 135–145)
Total Bilirubin: 0.5 mg/dL (ref 0.0–1.2)
Total Protein: 6 g/dL — ABNORMAL LOW (ref 6.5–8.1)

## 2024-07-10 LAB — CALCIUM, IONIZED
Calcium, Ionized, Serum: 4.1 mg/dL — ABNORMAL LOW (ref 4.5–5.6)
Calcium, Ionized, Serum: 4.4 mg/dL — ABNORMAL LOW (ref 4.5–5.6)

## 2024-07-10 LAB — GLUCOSE, CAPILLARY: Glucose-Capillary: 98 mg/dL (ref 70–99)

## 2024-07-10 LAB — PARATHYROID HORMONE, INTACT (NO CA): PTH: 55 pg/mL (ref 15–65)

## 2024-07-10 LAB — MAGNESIUM: Magnesium: 2.3 mg/dL (ref 1.7–2.4)

## 2024-07-10 MED ORDER — CALCIUM CARBONATE 1250 (500 CA) MG PO TABS
1.0000 | ORAL_TABLET | Freq: Every day | ORAL | 0 refills | Status: AC
  Filled 2024-07-10: qty 90, 90d supply, fill #0

## 2024-07-10 MED ORDER — ASPIRIN 81 MG PO TBEC: 81.0000 mg | DELAYED_RELEASE_TABLET | Freq: Every day | ORAL | Status: AC

## 2024-07-10 MED ORDER — CALCIUM CARBONATE 1250 (500 CA) MG PO TABS
1.0000 | ORAL_TABLET | Freq: Every day | ORAL | Status: DC
  Administered 2024-07-10: 1250 mg via ORAL
  Filled 2024-07-10: qty 1

## 2024-07-10 NOTE — Discharge Summary (Signed)
 Physician Discharge Summary  Jamie Gilmore FMW:[id number] DOB: 05-20-1946 DOA: 07/08/2024  PCP: Kip Righter, MD  Admit date: 07/08/2024 Discharge date: 07/10/24  Admitted From: Home Disposition: Home Recommendations for Outpatient Follow-up:  Outpatient follow-up with PCP in 1 to 2 weeks Check CMP, CBC, magnesium  and phosphorus at follow-up Please follow up on the following pending results: PTH  Home Health: None need identified. Equipment/Devices: Patient has cane and rolling walker  Discharge Condition: Stable CODE STATUS: Full code Diet Orders (From admission, onward)     Start     Ordered   07/09/24 0442  Diet Carb Modified Fluid consistency: Thin; Room service appropriate? Yes  Diet effective now       Question Answer Comment  Diet-HS Snack? Snack-yes   Calorie Level Medium 1600-2000   Fluid consistency: Thin   Room service appropriate? Yes      07/09/24 0442             Follow-up Information     Kip Righter, MD. Schedule an appointment as soon as possible for a visit in 1 week(s).   Specialty: Family Medicine Contact information: [address] [place] [address] [phone]                 Hospital course 78 year old F with PMH of HTN and provoked DVT after knee replacement and DM-2 presented to PCP office with tingling and muscle cramps for 2 days and found to have low calcium  at 6.1.  She was advised to go to ED for further evaluation and treatment.  In ED, stable vitals.  Calcium  level 6.1.  Magnesium  1.4.  Started on IV calcium  and IV magnesium , and admitted for further care.  Patient's calcium  improved to 7.7 which corrects to 8.6 for hypoalbuminemia.  Ionized calcium  slightly low at 4.1.  Vitamin D  within normal.  Symptoms resolved.  Etiology for hypocalcemia and hypomagnesemia unclear.  Patient is discharged on p.o. calcium  carbonate.  PTH pending at time of discharge.  Outpatient follow-up with PCP in 1 to 2  weeks or sooner if needed.  See individual problem list below for more.   Problems addressed during this hospitalization Symptomatic hypocalcemia -follow-up PTH NIDDM-2 Hypokalemia hypomagnesemia Class I obesity Body mass index is 33.53 kg/m.           Consultations: None  Time spent 35  minutes  Vital signs Vitals:   07/09/24 1655 07/09/24 2112 07/10/24 0613 07/10/24 0834  BP: 126/72 122/73 120/79 (!) 140/65  Pulse: 69 65 75 63  Temp: 98.5 F (36.9 C) 98.1 F (36.7 C) 98.1 F (36.7 C) 97.6 F (36.4 C)  Resp: 18 19 19 18   Height:      Weight:      SpO2: 97% 96% 98% 99%  TempSrc:  Oral Oral Oral  BMI (Calculated):         Discharge exam  GENERAL: No apparent distress.  Nontoxic. HEENT: MMM.  Vision and hearing grossly intact.  NECK: Supple.  No apparent JVD.  RESP:  No IWOB.  Fair aeration bilaterally. CVS:  RRR. Heart sounds normal.  ABD/GI/GU: BS+. Abd soft, NTND.  MSK/EXT:  Moves extremities. No apparent deformity. No edema.  SKIN: no apparent skin lesion or wound NEURO: Awake and alert. Oriented appropriately.  No apparent focal neuro deficit. PSYCH: Calm. Normal affect.   Discharge Instructions Discharge Instructions     Discharge instructions   Complete by: As directed    It has been a pleasure taking  care of you!  You were hospitalized due to low calcium  level that has improved with treatment.  It is unclear what caused your low calcium  level.  We are discharging you on calcium  tablet.  Please take your medications as prescribed.  Follow-up with your primary care doctor in 1 to 2 weeks or sooner if needed.   Take care,   Increase activity slowly   Complete by: As directed       Allergies as of 07/10/2024       Reactions   Bupropion Other (See Comments)   Ringing in ears   Lisinopril Cough        Medication List     STOP taking these medications    aspirin  325 MG tablet Replaced by: aspirin  EC 81 MG tablet       TAKE these  medications    albuterol  108 (90 Base) MCG/ACT inhaler Commonly known as: VENTOLIN  HFA Inhale 2 puffs into the lungs every 6 (six) hours as needed for shortness of breath or wheezing.   ALPRAZolam 0.25 MG tablet Commonly known as: XANAX Take 0.25 mg by mouth daily as needed for anxiety.   amLODipine  2.5 MG tablet Commonly known as: NORVASC  Take 2.5 mg by mouth daily.   aspirin  EC 81 MG tablet Take 1 tablet (81 mg total) by mouth daily. Swallow whole. Replaces: aspirin  325 MG tablet   atorvastatin  80 MG tablet Commonly known as: LIPITOR TAKE 1 TABLET BY MOUTH EVERY DAY   calcium  carbonate 1250 (500 Ca) MG tablet Commonly known as: OS-CAL - dosed in mg of elemental calcium  Take 1 tablet (1,250 mg total) by mouth daily with breakfast.   FLUoxetine  40 MG capsule Commonly known as: PROZAC  Take 40 mg by mouth at bedtime.   gabapentin 300 MG capsule Commonly known as: NEURONTIN Take 300 mg by mouth at bedtime.   metFORMIN 500 MG 24 hr tablet Commonly known as: GLUCOPHAGE-XR Take 500 mg by mouth every evening.   Mounjaro 2.5 MG/0.5ML Pen Generic drug: tirzepatide SMARTSIG:25 unspecified SUB-Q Once a Week   nystatin cream Commonly known as: MYCOSTATIN Apply 1 Application topically daily as needed (rash).   pantoprazole  40 MG tablet Commonly known as: PROTONIX  Take 1 tablet by mouth daily.   Potassium Chloride  ER 20 MEQ Tbcr Take 1 tablet by mouth daily.   Stiolto Respimat  2.5-2.5 MCG/ACT Aers Generic drug: Tiotropium Bromide-Olodaterol Inhale 2 puffs into the lungs daily. What changed:  when to take this reasons to take this   traMADol 50 MG tablet Commonly known as: ULTRAM Take 50 mg by mouth every 6 (six) hours as needed for moderate pain.   traZODone  50 MG tablet Commonly known as: DESYREL  Take 1 tablet (50 mg total) by mouth at bedtime. What changed:  when to take this reasons to take this         Procedures/Studies:   No results  found.     The results of significant diagnostics from this hospitalization (including imaging, microbiology, ancillary and laboratory) are listed below for reference.     Microbiology: No results found for this or any previous visit (from the past 240 hours).   Labs:  CBC: Recent Labs  Lab 07/08/24 1525 07/09/24 0551  WBC 8.7 7.2  HGB 11.6* 11.4*  HCT 35.0* 33.7*  MCV 90.9 89.9  PLT 297 296   BMP &GFR Recent Labs  Lab 07/08/24 1525 07/08/24 2308 07/09/24 0551 07/09/24 1432 07/10/24 0437  NA 143  --  142 141 141  K 3.2* 3.5 3.5 3.7 4.0  CL 100  --  99 102 104  CO2 29  --  29 30 28   GLUCOSE 109*  --  94 106* 110*  BUN 12  --  <5* 7* 10  CREATININE 0.68  --  0.71 0.77 0.71  CALCIUM  6.1*  --  7.7* 7.8* 7.7*  MG 0.5* 1.4*  --  2.5* 2.3  PHOS 2.9  --   --   --   --    Estimated Creatinine Clearance: 66.9 mL/min (by C-G formula based on SCr of 0.71 mg/dL). Liver & Pancreas: Recent Labs  Lab 07/08/24 1639 07/09/24 0551 07/09/24 1432 07/10/24 0437  AST  --  19 17 16   ALT  --  14 13 12   ALKPHOS  --  57 58 51  BILITOT  --  0.8 0.6 0.5  PROT 6.7 5.8* 5.9* 6.0*  ALBUMIN 4.0 3.1* 3.0* 2.9*   No results for input(s): LIPASE, AMYLASE in the last 168 hours. No results for input(s): AMMONIA in the last 168 hours. Diabetic: Recent Labs    07/09/24 0551  HGBA1C 5.3   Recent Labs  Lab 07/09/24 0733 07/09/24 1110 07/09/24 1656 07/09/24 2127 07/10/24 0750  GLUCAP 92 93 93 126* 98   Cardiac Enzymes: No results for input(s): CKTOTAL, CKMB, CKMBINDEX, TROPONINI in the last 168 hours. No results for input(s): PROBNP in the last 8760 hours. Coagulation Profile: No results for input(s): INR, PROTIME in the last 168 hours. Thyroid  Function Tests: No results for input(s): TSH, T4TOTAL, FREET4, T3FREE, THYROIDAB in the last 72 hours. Lipid Profile: No results for input(s): CHOL, HDL, LDLCALC, TRIG, CHOLHDL, LDLDIRECT in the  last 72 hours. Anemia Panel: No results for input(s): VITAMINB12, FOLATE, FERRITIN, TIBC, IRON, RETICCTPCT in the last 72 hours. Urine analysis:    Component Value Date/Time   COLORURINE YELLOW 04/05/2015 0043   APPEARANCEUR CLEAR 04/05/2015 0043   LABSPEC 1.016 04/05/2015 0043   PHURINE 5.5 04/05/2015 0043   GLUCOSEU NEGATIVE 04/05/2015 0043   HGBUR NEGATIVE 04/05/2015 0043   BILIRUBINUR NEGATIVE 04/05/2015 0043   KETONESUR NEGATIVE 04/05/2015 0043   PROTEINUR NEGATIVE 04/05/2015 0043   UROBILINOGEN 0.2 04/05/2015 0043   NITRITE NEGATIVE 04/05/2015 0043   LEUKOCYTESUR NEGATIVE 04/05/2015 0043   Sepsis Labs: Invalid input(s): PROCALCITONIN, LACTICIDVEN   SIGNED:  Mignon ONEIDA Bump, MD  [facility] 07/10/2024, 12:04 PM

## 2024-07-10 NOTE — TOC Transition Note (Signed)
 Transition of Care [facility]) - Discharge Note   Patient Details  Name: Jamie Gilmore MRN: [id number] Date of Birth: 1946/01/03  [facility] CM/SW Contact:  Tom-Johnson, Harvest Muskrat, RN Phone Number: 07/10/2024, 9:40 AM   Clinical Narrative:     Patient is scheduled for discharge today.  Readmission Risk Assessment done. Outpatient f/u, hospital f/u and discharge instructions on AVS. Prescriptions sent to [facility] pharmacy and patient will receive meds prior discharge. No ICM needs or recommendations noted. Significant Other, Ozell to transport at discharge.  No further ICM needs noted.       Final next level of care: Home/Self Care Barriers to Discharge: Barriers Resolved   Patient Goals and CMS Choice Patient states their goals for this hospitalization and ongoing recovery are:: To return home CMS Medicare.gov Compare Post Acute Care list provided to:: Patient Choice offered to / list presented to : NA      Discharge Placement                Patient to be transferred to facility by: Significant other Name of family member notified: Ozell    Discharge Plan and Services Additional resources added to the After Visit Summary for                  DME Arranged: N/A DME Agency: NA       HH Arranged: NA HH Agency: NA        Social Drivers of Health (SDOH) Interventions SDOH Screenings   Tobacco Use: Medium Risk (08/16/2023)     Readmission Risk Interventions    07/09/2024    3:10 PM  Readmission Risk Prevention Plan  Post Dischage Appt Complete  Medication Screening Complete  Transportation Screening Complete

## 2024-07-10 NOTE — Progress Notes (Signed)
 DISCHARGE NOTE HOME ROSHUNDA KEIR to be discharged Home per MD order. Discussed prescriptions and follow up appointments with the patient. Prescriptions given to patient; medication list explained in detail. Patient verbalized understanding.  Skin clean, dry and intact without evidence of skin break down, no evidence of skin tears noted. IV catheter discontinued intact. Site without signs and symptoms of complications. Dressing and pressure applied. Pt denies pain at the site currently. No complaints noted.  Patient free of lines, drains, and wounds.   An After Visit Summary (AVS) was printed and given to the patient. Patient escorted via wheelchair, and discharged home via private auto.  Doyal Sias, RN

## 2024-07-15 DIAGNOSIS — E876 Hypokalemia: Secondary | ICD-10-CM | POA: Diagnosis not present

## 2024-07-15 DIAGNOSIS — E538 Deficiency of other specified B group vitamins: Secondary | ICD-10-CM | POA: Diagnosis not present

## 2024-07-15 DIAGNOSIS — Z09 Encounter for follow-up examination after completed treatment for conditions other than malignant neoplasm: Secondary | ICD-10-CM | POA: Diagnosis not present

## 2024-07-29 ENCOUNTER — Encounter: Payer: Self-pay | Admitting: Cardiology
